# Patient Record
Sex: Male | Born: 1985 | Race: White | Hispanic: No | Smoking: Former smoker
Health system: Southern US, Community
[De-identification: ages and names within clinical notes are randomized; demographics above are authoritative.]

## PROBLEM LIST (undated history)

## (undated) DIAGNOSIS — K219 Gastro-esophageal reflux disease without esophagitis: Secondary | ICD-10-CM

## (undated) DIAGNOSIS — K644 Residual hemorrhoidal skin tags: Secondary | ICD-10-CM

## (undated) HISTORY — PX: KNEE SURGERY: SHX244

## (undated) HISTORY — PX: OTHER SURGICAL HISTORY: SHX169

## (undated) HISTORY — PX: TONSILLECTOMY AND ADENOIDECTOMY: SUR1326

## (undated) HISTORY — DX: Gastro-esophageal reflux disease without esophagitis: K21.9

## (undated) HISTORY — DX: Residual hemorrhoidal skin tags: K64.4

---
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
## 2008-08-30 ENCOUNTER — Emergency Department (HOSPITAL_COMMUNITY): Admission: EM | Admit: 2008-08-30 | Discharge: 2008-08-30 | Payer: Self-pay | Admitting: Emergency Medicine

## 2008-10-06 ENCOUNTER — Encounter: Admission: RE | Admit: 2008-10-06 | Discharge: 2008-10-06 | Payer: Self-pay | Admitting: Internal Medicine

## 2010-06-25 LAB — BASIC METABOLIC PANEL
BUN: 13 mg/dL (ref 6–23)
CO2: 28 mEq/L (ref 19–32)
Calcium: 10 mg/dL (ref 8.4–10.5)
Chloride: 103 mEq/L (ref 96–112)
Creatinine, Ser: 1.01 mg/dL (ref 0.4–1.5)
GFR calc Af Amer: >60 mL/min (ref >60)
GFR calc non Af Amer: >60 mL/min (ref >60)
Glucose, Bld: 89 mg/dL (ref 70–99)
Potassium: 3.8 mEq/L (ref 3.5–5.1)
Sodium: 139 mEq/L (ref 135–145)

## 2010-06-25 LAB — URINALYSIS, ROUTINE W REFLEX MICROSCOPIC
Bilirubin Urine: NEGATIVE
Glucose, UA: NEGATIVE mg/dL
Hgb urine dipstick: NEGATIVE
Ketones, ur: NEGATIVE mg/dL
Nitrite: NEGATIVE
Protein, ur: NEGATIVE mg/dL
Specific Gravity, Urine: 1.02 (ref 1.005–1.030)
Urobilinogen, UA: 0.2 mg/dL (ref 0.0–1.0)
pH: 6 (ref 5.0–8.0)

## 2010-08-03 NOTE — Consult Note (Signed)
NAME:  Aaron Waters, Aaron Waters             ACCOUNT NO.:  [id number]   MEDICAL RECORD NO.:  [id number]          PATIENT TYPE:  AMB   LOCATION:  DAY                           FACILITY:  [facility]   PHYSICIAN:  Kassie Mends, M.D.      DATE OF BIRTH:  1985/06/28   DATE OF CONSULTATION:  05/27/2006  DATE OF DISCHARGE:                                 CONSULTATION   REASON FOR CONSULTATION:  Weight loss and loose bowel movements.   HISTORY OF PRESENT ILLNESS:  Aaron Waters is an 25 year old male who has  always had loose bowel movements since he was a child.  His mother is  present for the history and exam.  She reports he has always had loose  bowel movements since he was a baby.  He always has to eat and go to the  bathroom.  Up until last year, it has not been affecting in the way that  it has been affecting him currently.  He is getting behind in his school  work, and he has also been losing a significant amount of weight.  His  mother and Dr. Neita Carp wondered if he needed to have a colonoscopy.  He  has not been having fevers associated with these stools.  His number of  stools depends on how much he eats.  He is now not eating because he  does not want to have a bowel movement.  He eats and now he may need to  have a bowel movement 10-15 minutes later.  He is not having any blood  in his stool, problems swallowing or pain with swallowing.  He denies  any nausea, vomiting, heartburn or indigestion.  Milk makes it the  symptoms worse, and so he is eliminated that from his diet.  In the  past, he has been tried on a lactose-free diet, but that does not seem  to make a difference in his symptoms.  He has not traveled anywhere and  does have well water.  He has not been on any antibiotics.  No one else  in his family has the same symptoms.  His father does have ulcerative  colitis for the last 15-20 years, but his symptoms are not the same.   PAST MEDICAL HISTORY:  None.   PAST SURGICAL HISTORY:  1.  Knee surgery.  2. Tonsillectomy.  3. Tubes in his ears.   ALLERGIES:  NO KNOWN DRUG ALLERGIES.   MEDICATIONS:  None.   FAMILY HISTORY:  He has an uncle who had colon cancer.   SOCIAL HISTORY:  He is a [employer].  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  He does not drink or smoke.   REVIEW OF SYSTEMS:  Per the HPI; otherwise all systems are negative.   PHYSICAL EXAMINATION:  VITAL SIGNS:  His weight is 196 pounds, height 5  feet 9 inches, BMI 28.9 (overweight), temperature 97.9, blood pressure  108/80, pulse 58.  GENERAL:  He is no apparent distress, alert and  orient x4.  HEENT:  Atraumatic and normocephalic.  Pupils equal and  reactive to light.  Mouth - no oral lesions.  Posterior pharynx without  erythema or exudate.  LUNGS:  Clear to auscultation bilaterally.  CARDIOVASCULAR:  Regular  rhythm, no murmur, normal S1 and S2.  ABDOMEN:  Bowel sounds are present, soft, nontender, nondistended.  No  rebound or guarding.  Unable to appreciate hepatosplenomegaly, abdominal  bruits or pulsatile masses.  EXTREMITIES:  Without cyanosis, clubbing or  edema.NEUROLOGIC:  He has no focal neurologic deficits.   LABORATORY DATA:  Labs from [facility]:  BUN 10, creatinine 0.9.  Sodium 140, potassium 4.8, albumin 4.8, total bilirubin 0.8, alkaline  phosphatase 127, AST 26, ALT 11.  White count 6.1, hemoglobin 14.6,  platelets 232.  Amylase 44, lipase 23.   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  The differential diagnosis  includes celiac sprue, giardiasis and microscopic colitis.  He could  also have a functional gut disorder, small bowel bacterial overgrowth or  lactose intolerance.  The degree of weight loss is concerning.  Thank  you for allowing me to see Aaron Waters in consultation.  My  recommendations follow.   RECOMMENDATIONS:  1. Aaron Waters will be scheduled for an EGD with biopsies of his       duodenum to evaluate for celiac sprue.  A flexible sigmoidoscopy      with biopsies of the small intestines to evaluate for microscopic      colitis will be performed on that day.  2. He will be asked to provide a stool sample to evaluate for      giardiasis.  He will also be asked to bring in a stool sample for      fecal lactoferrin, as well.  3. He has a follow-up appointment to see me in 4-6 weeks.      Kassie Mends, M.D.  Electronically Signed     SM/MEDQ  D:  05/29/2006  T:  05/30/2006  Job:  [id number]   cc:   Fara Chute  Fax: [phone]

## 2010-08-03 NOTE — Op Note (Signed)
NAME:  Aaron Waters, Aaron Waters NO.:  [id number]   MEDICAL RECORD NO.:  [id number]          PATIENT TYPE:  OIB   LOCATION:  6126                         FACILITY:  [facility]   PHYSICIAN:  Burnard Bunting, M.D.    DATE OF BIRTH:  1985-11-23   DATE OF PROCEDURE:  01/25/2004  DATE OF DISCHARGE:                                 OPERATIVE REPORT   PREOPERATIVE DIAGNOSIS:  Left knee patellar instability.   POSTOPERATIVE DIAGNOSIS:  Left knee patellar instability.   OPERATION PERFORMED:  Left knee arthroscopy with intra-articular debridement  and open medial patellofemoral ligament repair and VMO reefing.   SURGEON:  Burnard Bunting, M.D.   ASSISTANT:  Jerolyn Shin. Tresa Res, M.D.   ANESTHESIA:  General endotracheal.   ESTIMATED BLOOD LOSS:  50 mL.   DRAINS:  None.   TOURNIQUET TIME:  120 at 300 mmHg.   DESCRIPTION OF PROCEDURE:  The patient was brought to the operating room  where general endotracheal was induced and preoperative intravenous  antibiotics were administered.  The left leg was prepped with DuraPrep  solution, cleaned to the foot and draped in a sterile manner.  Collier Flowers was  used to cover the operative field.  The leg was elevated and exsanguinated  with the Esmarch wrap.  Tourniquet was inflated.  Diagnostic arthroscopy was  performed through a lateral portal.  A lateral subluxation of the patella  was noted.  Medial retinacular injury was present.  No significant loose  bodies were present within the joint.  Medial and lateral compartments were  intact.  The patient did have synovitis and evidence of injury on the  posterior aspect of the lateral femoral condyle with a loose subchondral  flap.  This was debrided with a shaver using an accessory lateral portal.  At this time following arthroscopic evaluation, the instruments were  removed.  An incision was then made beginning midway between the medial  border of the patella and the adductor tubercle.  Skin and  subcutaneous  tissue was sharply divided.  Bleeding points encountered were controlled  using electrocautery.  The anterior medial one forth of the patella was  exposed.  Skin was elevated off of the VMO down to the adductor tubercle.  At this time the patient was noted to have injury of the medial  patellofemoral ligament off both the medial epicondyle and the medial aspect  of the patella.  Soft tissue sleeve was elevated off of the medial aspect of  the patella.  This tissue sleeve was taken off the patella from the 7  o'clock to the 11 o'clock position on the left knee.  Three layers were  developed including the semimembranosus, including past expansion layer 1  followed by the medial patellofemoral ligament layer 2 and the joint capsule  in layer 3.  This dissection was carried down to the adductor tubercle.  The  ligament injury was noted to have pulled the ligament off of the adductor  tubercle.  Two 5-0 suture anchors were then placed at the adductor tubercle.  The medial patellofemoral ligament was then anchored back down with four  mattress sutures to the adductor tubercle.  ___________ drill was then used  to drill all four holes through the patella exiting anterior to the  articular surface junction.  Using #1 Ethibond suture, imbrication of the  anchor medial patellofemoral ligament was then performed with the patella  well centered.  The sutures were tied after imbrication and good medial  restraint had been restored with the knee in both full extension and 20  degrees of flexion.  He was taken through a range of motion.  Patella was  found to be stable.  The trochlear groove was quite flat.  It was elected at  this time to forego lateral release in order to avoid any possible further  vascular insult into the tibial patella itself.  The patella also was very  stable with the medial plication and repair of the medial patellofemoral  ligament.  The VMO was then advanced over  the tissue on the patella.  At  this time the tourniquet was released.  Bleeding points encountered were  controlled using electrocautery.  Knee joint was thoroughly irrigated.  The  incision was closed using interrupted inverted 0 Vicryl suture, 2-0 Vicryl  suture and running 3-0 pull-out Prolene.  Portal sutures were closed using 3-  0 Prolene.  The patient was then placed in bulky dressing and knee  immobilizer.  The patient tolerated the procedure well without immediate  complications.       GSD/MEDQ  D:  01/26/2004  T:  01/26/2004  Job:  [id number]

## 2010-08-03 NOTE — Op Note (Signed)
NAME:  Aaron Waters, Aaron Waters             ACCOUNT NO.:  [id number]   MEDICAL RECORD NO.:  [id number]          PATIENT TYPE:  AMB   LOCATION:  DAY                           FACILITY:  [facility]   PHYSICIAN:  Kassie Mends, M.D.      DATE OF BIRTH:  April 08, 1985   DATE OF PROCEDURE:  06/02/2006  DATE OF DISCHARGE:                               OPERATIVE REPORT   PROCEDURE:  1. Sigmoidoscopy with cold forceps biopsy.  2. Esophagogastroduodenoscopy with cold forceps biopsy.   INDICATIONS:  Aaron Waters is an 25 year old male with weight loss and  loose bowel movements.  His father has ulcerative colitis.   FINDINGS:  1. Normal colon to the distal transverse colon.  Biopsies obtained to      rule out microscopic colitis.  2. Normal esophagus without evidence of Barrett's.  Normal stomach.      Normal duodenal bulb and second portion of the duodenum.  Biopsies      obtained to evaluate for celiac sprue.   RECOMMENDATIONS:  1. Will call Aaron Waters with biopsy results.  2. No aspirin, NSAIDs or anticoagulation for 3 days.  3. Resume previous diet.  4. He has a follow-up appointment to see me in 4 to 6 weeks.  If his      biopsies are negative, then will obtain stool samples for Giardia      antigen and 72 hour stool collection for fecal fat. Consider 24-      hour urine collection for 5-HIAA core or CT scan of the abdomen to      look for cold pancreatic lesion such as a gastrinoma or other      pancreatic lesion that can cause diarrhea such as a VIPoma.      Consider  the obtaining fasting gastric level or the VIP levels.   MEDICATIONS:  1. Demerol 100 mg IV.  2. Versed 9 mg IV.   PROCEDURE TECHNIQUE:  Physical exam was performed.  Informed consent was  obtained from the patient after explaining the benefits, risks and  alternatives to procedure.  The patient was connected to the monitor and  placed in left lateral position.  Continuous oxygen was provided by  nasal cannula and IV medicine  administered through an indwelling  cannula.  After administration of sedation, the patient's rectum was  intubated and the scope was advanced under direct visualization to the  distal transverse colon.  The scope was subsequently removed slowly by  carefully examining the color, texture, anatomy and end of the mucosa on  the way out.   After the sigmoidoscopy, the patient's esophagus was intubated with a  diagnostic gastroscope and advanced under direct visualization to the  second portion of the duodenum.  The scope was subsequently slowly by  carefully examining color, texture, anatomy and integrity of the mucosa  on the way out.  The patient was recovered in endoscopy suite and  discharged to home in satisfactory condition.      Kassie Mends, M.D.  Electronically Signed     SM/MEDQ  D:  06/02/2006  T:  06/02/2006  Job:  [id number]   cc:   Fara Chute  Fax: [phone]

## 2013-05-24 ENCOUNTER — Emergency Department (HOSPITAL_COMMUNITY)
Admission: EM | Admit: 2013-05-24 | Discharge: 2013-05-24 | Disposition: A | Discharge status: Home / Self Care | Payer: BC Managed Care – PPO | Attending: Emergency Medicine | Admitting: Emergency Medicine

## 2013-05-24 ENCOUNTER — Encounter (HOSPITAL_COMMUNITY): Payer: Self-pay | Admitting: Emergency Medicine

## 2013-05-24 DIAGNOSIS — R1084 Generalized abdominal pain: Secondary | ICD-10-CM | POA: Insufficient documentation

## 2013-05-24 DIAGNOSIS — K625 Hemorrhage of anus and rectum: Secondary | ICD-10-CM | POA: Insufficient documentation

## 2013-05-24 LAB — COMPREHENSIVE METABOLIC PANEL
ALT: 18 U/L (ref 0–53)
AST: 18 U/L (ref 0–37)
Albumin: 4.3 g/dL (ref 3.5–5.2)
Alkaline Phosphatase: 83 U/L (ref 39–117)
BUN: 10 mg/dL (ref 6–23)
CO2: 28 mEq/L (ref 19–32)
Calcium: 9.8 mg/dL (ref 8.4–10.5)
Chloride: 103 mEq/L (ref 96–112)
Creatinine, Ser: 1.02 mg/dL (ref 0.50–1.35)
GFR calc Af Amer: >90 mL/min (ref >90)
GFR calc non Af Amer: >90 mL/min (ref >90)
Glucose, Bld: 92 mg/dL (ref 70–99)
Potassium: 4 mEq/L (ref 3.7–5.3)
Sodium: 142 mEq/L (ref 137–147)
Total Bilirubin: 0.3 mg/dL (ref 0.3–1.2)
Total Protein: 7.2 g/dL (ref 6.0–8.3)

## 2013-05-24 LAB — LIPASE, BLOOD: Lipase: 28 U/L (ref 11–59)

## 2013-05-24 LAB — CBC
HCT: 43.1 % (ref 39.0–52.0)
Hemoglobin: 15 g/dL (ref 13.0–17.0)
MCH: 30.1 pg (ref 26.0–34.0)
MCHC: 34.8 g/dL (ref 30.0–36.0)
MCV: 86.5 fL (ref 78.0–100.0)
Platelets: 235 10*3/uL (ref 150–400)
RBC: 4.98 MIL/uL (ref 4.22–5.81)
RDW: 12.6 % (ref 11.5–15.5)
WBC: 7.6 10*3/uL (ref 4.0–10.5)

## 2013-05-24 LAB — TYPE AND SCREEN
ABO/RH(D): O POS
Antibody Screen: NEGATIVE

## 2013-05-24 LAB — ABO/RH: ABO/RH(D): O POS

## 2013-05-24 NOTE — Discharge Instructions (Signed)
Abdominal Pain, Adult Many things can cause abdominal pain. Usually, abdominal pain is not caused by a disease and will improve without treatment. It can often be observed and treated at home. Your health care provider will do a physical exam and possibly order blood tests and X-rays to help determine the seriousness of your pain. However, in many cases, more time must pass before a clear cause of the pain can be found. Before that point, your health care provider may not know if you need more testing or further treatment. HOME CARE INSTRUCTIONS  Monitor your abdominal pain for any changes. The following actions may help to alleviate any discomfort you are experiencing:  Only take over-the-counter or prescription medicines as directed by your health care provider.  Do not take laxatives unless directed to do so by your health care provider.  Try a clear liquid diet (broth, tea, or water) as directed by your health care provider. Slowly move to a bland diet as tolerated. SEEK MEDICAL CARE IF:  You have unexplained abdominal pain.  You have abdominal pain associated with nausea or diarrhea.  You have pain when you urinate or have a bowel movement.  You experience abdominal pain that wakes you in the night.  You have abdominal pain that is worsened or improved by eating food.  You have abdominal pain that is worsened with eating fatty foods. SEEK IMMEDIATE MEDICAL CARE IF:   Your pain does not go away within 2 hours.  You have a fever.  You keep throwing up (vomiting).  Your pain is felt only in portions of the abdomen, such as the right side or the left lower portion of the abdomen.  You pass bloody or black tarry stools. MAKE SURE YOU:  Understand these instructions.   Will watch your condition.   Will get help right away if you are not doing well or get worse.  Document Released: 12/12/2004 Document Revised: 12/23/2012 Document Reviewed: 11/11/2012 [facility] Patient  Information 2014 [place].  Bloody Stools Bloody stools often mean that there is a problem in the digestive tract. Your caregiver may use the term "melena" to describe black, tarry, and bad smelling stools or "hematochezia" to describe red or maroon-colored stools. Blood seen in the stool can be caused by bleeding anywhere along the intestinal tract.  A black stool usually means that blood is coming from the upper part of the gastrointestinal tract (esophagus, stomach, or small bowel). Passing maroon-colored stools or bright red blood usually means that blood is coming from lower down in the large bowel or the rectum. However, sometimes massive bleeding in the stomach or small intestine can cause bright red bloody stools.  Consuming black licorice, lead, iron pills, medicines containing bismuth subsalicylate, or blueberries can also cause black stools. Your caregiver can test black stools to see if blood is present. It is important that the cause of the bleeding be found. Treatment can then be started, and the problem can be corrected. Rectal bleeding may not be serious, but you should not assume everything is okay until you know the cause.It is very important to follow up with your caregiver or a specialist in gastrointestinal problems. CAUSES  Blood in the stools can come from various underlying causes.Often, the cause is not found during your first visit. Testing is often needed to discover the cause of bleeding in the gastrointestinal tract. Causes range from simple to serious or even life-threatening.Possible causes include:  Hemorrhoids.These are veins that are full of blood (engorged)  in the rectum. They cause pain, inflammation, and may bleed.  Anal fissures.These are areas of painful tearing which may bleed. They are often caused by passing hard stool.  Diverticulosis.These are pouches that form on the colon over time, with age, and may bleed significantly.  Diverticulitis.This  is inflammation in areas with diverticulosis. It can cause pain, fever, and bloody stools, although bleeding is rare.  Proctitis and colitis. These are inflamed areas of the rectum or colon. They may cause pain, fever, and bloody stools.  Polyps and cancer. Colon cancer is a leading cause of preventable cancer death.It often starts out as precancerous polyps that can be removed during a colonoscopy, preventing progression into cancer. Sometimes, polyps and cancer may cause rectal bleeding.  Gastritis and ulcers.Bleeding from the upper gastrointestinal tract (near the stomach) may travel through the intestines and produce black, sometimes tarry, often bad smelling stools. In certain cases, if the bleeding is fast enough, the stools may not be black, but red and the condition may be life-threatening. SYMPTOMS  You may have stools that are bright red and bloody, that are normal color with blood on them, or that are dark black and tarry. In some cases, you may only have blood in the toilet bowl. Any of these cases need medical care. You may also have:  Pain at the anus or anywhere in the rectum.  Lightheadedness or feeling faint.  Extreme weakness.  Nausea or vomiting.  Fever. DIAGNOSIS Your caregiver may use the following methods to find the cause of your bleeding:  Taking a medical history. Age is important. Older people tend to develop polyps and cancer more often. If there is anal pain and a hard, large stool associated with bleeding, a tear of the anus may be the cause. If blood drips into the toilet after a bowel movement, bleeding hemorrhoids may be the problem. The color and frequency of the bleeding are additional considerations. In most cases, the medical history provides clues, but seldom the final answer.  A visual and finger (digital) exam. Your caregiver will inspect the anal area, looking for tears and hemorrhoids. A finger exam can provide information when there is tenderness or  a growth inside. In men, the prostate is also examined.  Endoscopy. Several types of small, long scopes (endoscopes) are used to view the colon.  In the office, your caregiver may use a rigid, or more commonly, a flexible viewing sigmoidoscope. This exam is called flexible sigmoidoscopy. It is performed in 5 to 10 minutes.  A more thorough exam is accomplished with a colonoscope. It allows your caregiver to view the entire 5 to 6 foot long colon. Medicine to help you relax (sedative) is usually given for this exam. Frequently, a bleeding lesion may be present beyond the reach of the sigmoidoscope. So, a colonoscopy may be the best exam to start with. Both exams are usually done on an outpatient basis. This means the patient does not stay overnight in the hospital or surgery center.  An upper endoscopy may be needed to examine your stomach. Sedation is used and a flexible endoscope is put in your mouth, down to your stomach.  A barium enema X-ray. This is an X-ray exam. It uses liquid barium inserted by enema into the rectum. This test alone may not identify an actual bleeding point. X-rays highlight abnormal shadows, such as those made by lumps (tumors), diverticuli, or colitis. TREATMENT  Treatment depends on the cause of your bleeding.   For bleeding from  the stomach or colon, the caregiver doing your endoscopy or colonoscopy may be able to stop the bleeding as part of the procedure.  Inflammation or infection of the colon can be treated with medicines.  Many rectal problems can be treated with creams, suppositories, or warm baths.  Surgery is sometimes needed.  Blood transfusions are sometimes needed if you have lost a lot of blood.  For any bleeding problem, let your caregiver know if you take aspirin or other blood thinners regularly. HOME CARE INSTRUCTIONS   Take any medicines exactly as prescribed.  Keep your stools soft by eating a diet high in fiber. Prunes (1 to 3 a day) work  well for many people.  Drink enough water and fluids to keep your urine clear or pale yellow.  Take sitz baths if advised. A sitz bath is when you sit in a bathtub with warm water for 10 to 15 minutes to soak, soothe, and cleanse the rectal area.  If enemas or suppositories are advised, be sure you know how to use them. Tell your caregiver if you have problems with this.  Monitor your bowel movements to look for signs of improvement or worsening. SEEK MEDICAL CARE IF:   You do not improve in the time expected.  Your condition worsens after initial improvement.  You develop any new symptoms. SEEK IMMEDIATE MEDICAL CARE IF:   You develop severe or prolonged rectal bleeding.  You vomit blood.  You feel weak or faint.  You have a fever. MAKE SURE YOU:  Understand these instructions.  Will watch your condition.  Will get help right away if you are not doing well or get worse. Document Released: 02/22/2002 Document Revised: 05/27/2011 Document Reviewed: 07/20/2010 [facility] Patient Information 2014 [place].

## 2013-05-24 NOTE — ED Provider Notes (Signed)
CSN: [id number]     Arrival date & time 05/24/13  1135 History   First MD Initiated Contact with Patient 05/24/13 1421     Chief Complaint  Patient presents with  . Rectal Bleeding     Patient is a 28 y.o. male presenting with hematochezia. The history is provided by the patient.  Rectal Bleeding Quality:  Bright red Duration:  12 months Timing:  Intermittent (Pt noticed it every time he has a bowel movement.) Context: not anal fissures, not rectal injury and not rectal pain   Relieved by:  Nothing Worsened by:  Nothing tried Ineffective treatments:  None tried Associated symptoms: abdominal pain   Associated symptoms: no fever, no hematemesis and no vomiting     History reviewed. No pertinent past medical history. History reviewed. No pertinent past surgical history. Family History  Problem Relation Age of Onset  . Ulcerative colitis Father    History  Substance Use Topics  . Smoking status: Never Smoker   . Smokeless tobacco: Not on file  . Alcohol Use: No    Review of Systems  Constitutional: Negative for fever.  Gastrointestinal: Positive for abdominal pain and hematochezia. Negative for vomiting and hematemesis.  All other systems reviewed and are negative.      Allergies  Review of patient's allergies indicates no known allergies.  Home Medications  No current outpatient prescriptions on file. BP 115/67  Pulse 61  Temp(Src) 98.5 F (36.9 C) (Oral)  Resp 18  Wt 241 lb 11.2 oz (109.634 kg)  SpO2 97% Physical Exam  Nursing note and vitals reviewed. Constitutional: He appears well-developed and well-nourished. No distress.  HENT:  Head: Normocephalic and atraumatic.  Right Ear: External ear normal.  Left Ear: External ear normal.  Eyes: Conjunctivae are normal. Right eye exhibits no discharge. Left eye exhibits no discharge. No scleral icterus.  Neck: Neck supple. No tracheal deviation present.  Cardiovascular: Normal rate, regular rhythm and intact  distal pulses.   Pulmonary/Chest: Effort normal and breath sounds normal. No stridor. No respiratory distress. He has no wheezes. He has no rales.  Abdominal: Soft. Bowel sounds are normal. He exhibits no distension. There is generalized tenderness. There is no rigidity, no rebound and no guarding. No hernia.  Genitourinary: Rectal exam shows no external hemorrhoid, no internal hemorrhoid and no mass.  No gross blood on rectal exam  Musculoskeletal: He exhibits no edema and no tenderness.  Neurological: He is alert. He has normal strength. No cranial nerve deficit (no facial droop, extraocular movements intact, no slurred speech) or sensory deficit. He exhibits normal muscle tone. He displays no seizure activity. Coordination normal.  Skin: Skin is warm and dry. No rash noted.  Psychiatric: He has a normal mood and affect.    ED Course  Procedures (including critical care time) Labs Review Labs Reviewed  CBC  COMPREHENSIVE METABOLIC PANEL  LIPASE, BLOOD  TYPE AND SCREEN  ABO/RH     MDM   Final diagnoses:  Rectal bleeding    The patient's laboratory testing and exam are reassuring. Patient does have a family history of ulcerative colitis. Considering the one year duration of his symptoms further evaluation is certainly indicated. I discussed the importance of outpatient followup with a gastroenterologist. The family would like to followup with Dr. Karilyn Cota in [place]  At this time there does not appear to be any evidence of an acute emergency medical condition and the patient appears stable for discharge with appropriate outpatient follow up.  Celene KrasJon R Ronalda Walpole, MD 05/24/13 [phone]

## 2013-05-24 NOTE — ED Notes (Signed)
Per pt sts generalized abdominal pain with rectal bleeding x 2 weeks. sts now he is having more blood than stool. sts bright red.

## 2013-05-24 NOTE — ED Notes (Signed)
Pt comfortable with discharge and follow up instructions. No prescriptions. 

## 2013-05-28 ENCOUNTER — Ambulatory Visit (INDEPENDENT_AMBULATORY_CARE_PROVIDER_SITE_OTHER): Payer: BC Managed Care – PPO | Admitting: Physician Assistant

## 2013-05-28 ENCOUNTER — Other Ambulatory Visit (INDEPENDENT_AMBULATORY_CARE_PROVIDER_SITE_OTHER): Payer: BC Managed Care – PPO

## 2013-05-28 ENCOUNTER — Telehealth: Payer: Self-pay | Admitting: Gastroenterology

## 2013-05-28 ENCOUNTER — Encounter: Payer: Self-pay | Admitting: Physician Assistant

## 2013-05-28 VITALS — BP 124/74 | HR 66 | Ht 72.0 in | Wt 242.2 lb

## 2013-05-28 DIAGNOSIS — R109 Unspecified abdominal pain: Secondary | ICD-10-CM

## 2013-05-28 DIAGNOSIS — R197 Diarrhea, unspecified: Secondary | ICD-10-CM

## 2013-05-28 DIAGNOSIS — K219 Gastro-esophageal reflux disease without esophagitis: Secondary | ICD-10-CM

## 2013-05-28 DIAGNOSIS — K625 Hemorrhage of anus and rectum: Secondary | ICD-10-CM

## 2013-05-28 LAB — CBC WITH DIFFERENTIAL/PLATELET
BASOS ABS: 0.1 10*3/uL (ref 0.0–0.1)
Basophils Relative: 1.1 % (ref 0.0–3.0)
EOS ABS: 0.2 10*3/uL (ref 0.0–0.7)
Eosinophils Relative: 2.1 % (ref 0.0–5.0)
HCT: 44.3 % (ref 39.0–52.0)
Hemoglobin: 15.2 g/dL (ref 13.0–17.0)
LYMPHS PCT: 27.8 % (ref 12.0–46.0)
Lymphs Abs: 2.5 10*3/uL (ref 0.7–4.0)
MCHC: 34.2 g/dL (ref 30.0–36.0)
MCV: 86.9 fl (ref 78.0–100.0)
Monocytes Absolute: 0.6 10*3/uL (ref 0.1–1.0)
Monocytes Relative: 7.1 % (ref 3.0–12.0)
Neutro Abs: 5.5 10*3/uL (ref 1.4–7.7)
Neutrophils Relative %: 61.9 % (ref 43.0–77.0)
Platelets: 268 10*3/uL (ref 150.0–400.0)
RBC: 5.1 Mil/uL (ref 4.22–5.81)
RDW: 12.7 % (ref 11.5–14.6)
WBC: 8.8 10*3/uL (ref 4.5–10.5)

## 2013-05-28 LAB — HIGH SENSITIVITY CRP: CRP, High Sensitivity: 0.69 mg/L (ref 0.000–5.000)

## 2013-05-28 MED ORDER — DICYCLOMINE HCL 10 MG PO CAPS: ORAL_CAPSULE | ORAL | Status: DC

## 2013-05-28 MED ORDER — MOVIPREP 100 G PO SOLR: 1.0000 | ORAL | Status: DC

## 2013-05-28 NOTE — Telephone Encounter (Signed)
Pt seen in ER this week for rectal bleeding. State it has been going on for a while but has gotten worse. They were going to try and see a GI doc in [place] but were told they could not be seen for 3-4 weeks. Pt scheduled to see Mike GipAmy Esterwood PA today at 3:30pm. Pt aware of appt.

## 2013-05-28 NOTE — Patient Instructions (Signed)
We have given you a work note. We sent prescriptions to [facility]. 1. Bentyl ( dicyclomine) 2. Moviprep for the colonoscopy  Take Tylenol or Tylenol Extra Strength as needed for pain.  You have been scheduled for a colonoscopy with propofol. Please follow written instructions given to you at your visit today.  Please pick up your prep kit at the pharmacy within the next 1-3 days. [facility] [place], [place]. If you use inhalers (even only as needed), please bring them with you on the day of your procedure.

## 2013-05-28 NOTE — Progress Notes (Addendum)
Subjective:    Patient ID: Aaron Waters, male    DOB: 30-Sep-1985, 28 y.o.   MRN: [id number]  HPI  Muhamad is a very nice 28 year old white male generally in good health with no known chronic medical problems. He comes in today is a new patient after an ER visit on 05/24/2013. Patient reports that he has had intermittent rectal bleeding over the past year and has had loose stools for many years. His mother says that he has always had some postprandial urgency and usually has a bowel movement after eating a meal. Over the past couple of months he has had an increase in rectal bleeding and now over the past week or so he says that he is primarily just passing blood whenever he has a bowel movement. His stools are liquid and he is having 5-7 bowel movements per day. He has also developed abdominal cramping and discomfort over the past few weeks. No documented fever or chills. He says in general he doesn't feel very well and has had some lightheadedness while at work. Appetite has been okay, weight stable, no vomiting.  Patient is not on any regular meds or over-the-counter medicines he occasionally takes Nexium for reflux. Family history is positive for remote diagnosis of ulcerative colitis in his father  ER evaluation on 39 labs showed hemoglobin of 15 hematocrit of 43 WBC of 7.6 C. met was normal no imaging done.    Review of Systems  Constitutional: Positive for appetite change.  HENT: Negative.   Eyes: Negative.   Respiratory: Negative.   Cardiovascular: Negative.   Gastrointestinal: Positive for abdominal pain, diarrhea and blood in stool.  Endocrine: Negative.   Genitourinary: Negative.   Musculoskeletal: Negative.   Allergic/Immunologic: Negative.   Neurological: Negative.   Hematological: Negative.   Psychiatric/Behavioral: Negative.    No outpatient prescriptions prior to visit.   No facility-administered medications prior to visit.   No Known Allergies     Patient  Active Problem List   Diagnosis Date Noted  . GERD (gastroesophageal reflux disease) 05/28/2013   History  Substance Use Topics  . Smoking status: Former Smoker    Types: Cigarettes  . Smokeless tobacco: Current User    Types: Snuff, Chew     Comment: tobacco info given 05/28/13  . Alcohol Use: No   family history includes Colon cancer (age of onset: 90) in his maternal uncle; Heart attack in his paternal grandfather; Ulcerative colitis in his father.  Objective:   Physical Exam  well-developed white male in no acute distress, accompanied by his mother blood pressure 124/74 pulse 66 height 6 foot weight 242. HEENT; nontraumatic normocephalic EOMI PERRLA sclera anicteric is, Supple ;no JVD, Cardiovascular; regular rate and rhythm with S1-S2 no murmur or gallop, Pulmonary; clear bilaterally, Abdomen; soft bowel sounds are somewhat hyperactive he is quite tender in the left lower quadrant left mid quadrant and in the epigastrium but no guarding or rebound no palpable mass or hepatosplenomegaly, Rectal ;exam not repeated this was done in the ER earlier this week no gross blood noted and negative digital exam, Extremities; no clubbing cyanosis or edema skin warm and dry, Psych; mood and affect appropriate        Assessment & Plan:  #52  28 year old male with a one-year history of intermittent rectal bleeding now with progressive symptoms over the past month and development of left-sided abdominal pain/cramping. Patient with 5-7 diarrheal stools per day which have been bloody over the past week and a  half High suspicion for inflammatory bowel disease/ulcerative colitis Recent hemoglobin was normal  #2 GERD-uses periodic Nexium Plan; repeat CBC today, check CRP Schedule for colonoscopy with Dr. Hilarie Fredrickson next week-procedure was discussed in detail with the patient and his mother and they're agreeable to proceed Patient was given a note to remain out of work until he has the procedure done Start  bentyl 10 mg every 6 hours as needed for abdominal cramping  Addendum: Reviewed and agree with initial management. Jerene Bears, MD

## 2013-06-01 ENCOUNTER — Other Ambulatory Visit: Payer: Self-pay | Admitting: Gastroenterology

## 2013-06-02 ENCOUNTER — Encounter (HOSPITAL_COMMUNITY)
Admission: RE | Disposition: A | Discharge status: Home / Self Care | Payer: Self-pay | Source: Ambulatory Visit | Attending: Internal Medicine

## 2013-06-02 ENCOUNTER — Other Ambulatory Visit: Payer: Self-pay | Admitting: Internal Medicine

## 2013-06-02 ENCOUNTER — Encounter: Payer: Self-pay | Admitting: *Deleted

## 2013-06-02 ENCOUNTER — Ambulatory Visit (HOSPITAL_COMMUNITY)
Admission: RE | Admit: 2013-06-02 | Discharge: 2013-06-02 | Disposition: A | Discharge status: Home / Self Care | Payer: BC Managed Care – PPO | Source: Ambulatory Visit | Attending: Internal Medicine | Admitting: Internal Medicine

## 2013-06-02 ENCOUNTER — Encounter (HOSPITAL_COMMUNITY): Payer: Self-pay

## 2013-06-02 DIAGNOSIS — K219 Gastro-esophageal reflux disease without esophagitis: Secondary | ICD-10-CM | POA: Insufficient documentation

## 2013-06-02 DIAGNOSIS — Z8379 Family history of other diseases of the digestive system: Secondary | ICD-10-CM | POA: Insufficient documentation

## 2013-06-02 DIAGNOSIS — R109 Unspecified abdominal pain: Secondary | ICD-10-CM

## 2013-06-02 DIAGNOSIS — Z800 Family history of malignant neoplasm of digestive organs: Secondary | ICD-10-CM | POA: Insufficient documentation

## 2013-06-02 DIAGNOSIS — Z87891 Personal history of nicotine dependence: Secondary | ICD-10-CM | POA: Insufficient documentation

## 2013-06-02 DIAGNOSIS — K644 Residual hemorrhoidal skin tags: Secondary | ICD-10-CM | POA: Insufficient documentation

## 2013-06-02 DIAGNOSIS — K625 Hemorrhage of anus and rectum: Secondary | ICD-10-CM

## 2013-06-02 DIAGNOSIS — R197 Diarrhea, unspecified: Secondary | ICD-10-CM

## 2013-06-02 HISTORY — PX: COLONOSCOPY: SHX5424

## 2013-06-02 LAB — CBC WITH DIFFERENTIAL/PLATELET
BASOS ABS: 0 10*3/uL (ref 0.0–0.1)
Basophils Relative: 0 % (ref 0–1)
EOS PCT: 1 % (ref 0–5)
Eosinophils Absolute: 0.1 10*3/uL (ref 0.0–0.7)
HCT: 46.2 % (ref 39.0–52.0)
Hemoglobin: 15.8 g/dL (ref 13.0–17.0)
Lymphocytes Relative: 29 % (ref 12–46)
Lymphs Abs: 2.1 10*3/uL (ref 0.7–4.0)
MCH: 29.4 pg (ref 26.0–34.0)
MCHC: 34.2 g/dL (ref 30.0–36.0)
MCV: 86 fL (ref 78.0–100.0)
Monocytes Absolute: 0.5 10*3/uL (ref 0.1–1.0)
Monocytes Relative: 7 % (ref 3–12)
Neutro Abs: 4.4 10*3/uL (ref 1.7–7.7)
Neutrophils Relative %: 62 % (ref 43–77)
PLATELETS: 232 10*3/uL (ref 150–400)
RBC: 5.37 MIL/uL (ref 4.22–5.81)
RDW: 12.7 % (ref 11.5–15.5)
WBC: 7 10*3/uL (ref 4.0–10.5)

## 2013-06-02 LAB — HIGH SENSITIVITY CRP: CRP HIGH SENSITIVITY: 2 mg/L

## 2013-06-02 SURGERY — COLONOSCOPY
Anesthesia: Moderate Sedation

## 2013-06-02 MED ORDER — FENTANYL CITRATE 0.05 MG/ML IJ SOLN
INTRAMUSCULAR | Status: DC | PRN
  Administered 2013-06-02 (×4): 25 ug via INTRAVENOUS

## 2013-06-02 MED ORDER — DIPHENHYDRAMINE HCL 50 MG/ML IJ SOLN
INTRAMUSCULAR | Status: AC
  Filled 2013-06-02: qty 1

## 2013-06-02 MED ORDER — SODIUM CHLORIDE 0.9 % IV SOLN: INTRAVENOUS | Status: DC

## 2013-06-02 MED ORDER — MIDAZOLAM HCL 5 MG/5ML IJ SOLN
INTRAMUSCULAR | Status: DC | PRN
  Administered 2013-06-02 (×4): 2 mg via INTRAVENOUS

## 2013-06-02 MED ORDER — FENTANYL CITRATE 0.05 MG/ML IJ SOLN
INTRAMUSCULAR | Status: AC
  Filled 2013-06-02: qty 4

## 2013-06-02 MED ORDER — HYDROCORTISONE ACETATE 25 MG RE SUPP: 25.0000 mg | Freq: Two times a day (BID) | RECTAL | Status: DC

## 2013-06-02 MED ORDER — DIPHENHYDRAMINE HCL 50 MG/ML IJ SOLN
INTRAMUSCULAR | Status: DC | PRN
  Administered 2013-06-02: 25 mg via INTRAVENOUS

## 2013-06-02 MED ORDER — MIDAZOLAM HCL 10 MG/2ML IJ SOLN
INTRAMUSCULAR | Status: AC
  Filled 2013-06-02: qty 4

## 2013-06-02 NOTE — Op Note (Signed)
[facility] [address] [place], [id number]   COLONOSCOPY PROCEDURE REPORT  PATIENT: Aaron Waters, Aaron F.  MR#: [id number] BIRTHDATE: 09/14/1985 , 27  yrs. old GENDER: Male ENDOSCOPIST: Beverley FiedlerJay M Pyrtle, MD PROCEDURE DATE:  06/02/2013 PROCEDURE:   Colonoscopy with biopsy First Screening Colonoscopy - Avg.  risk and is 50 yrs.  old or older - No.  Prior Negative Screening - Now for repeat screening. N/A  History of Adenoma - Now for follow-up colonoscopy & has been > or = to 3 yrs.  N/A  Polyps Removed Today? No.  Recommend repeat exam, <10 yrs? No. ASA CLASS:   Class II INDICATIONS:Rectal Bleeding.   Loose stools.  Left-sided abdominal pain.  Family history of colitis MEDICATIONS: These medications were titrated to patient response per physician's verbal order, Diphenhydramine (Benadryl) 25 mg IV, Fentanyl 100 mcg IV, and Versed 8 mg IV  DESCRIPTION OF PROCEDURE:   After the risks benefits and alternatives of the procedure were thoroughly explained, informed consent was obtained.  A digital rectal exam revealed external hemorrhoids.   The Adult Pentax colonoscope was introduced through the anus and advanced to the terminal ileum which was intubated for a short distance. No adverse events experienced.   The quality of the prep was good, using MoviPrep  The instrument was then slowly withdrawn as the colon was fully examined.   COLON FINDINGS: The mucosa appeared normal in the terminal ileum. The colonic mucosa appeared normal throughout the entire examined colon.  There was no evidence of mucosal inflammation and no polyps or tumors were seen. Multiple biopsies were performed from the rectum to exclude proctitis.  Retroflexed views revealed small, mildly inflamed external hemorrhoid.      The scope was withdrawn and the procedure completed.  COMPLICATIONS: There were no complications.  ENDOSCOPIC IMPRESSION: 1.   Normal mucosa in the terminal ileum 2.   The  colonic mucosa appeared normal throughout the entire examined colon; rectal biopsies  RECOMMENDATIONS: 1.  Await biopsy results 2.  Hydrocortisone suppository 25 mg twice daily for 5 days 3.  Bentyl as directed and as needed for lower abdominal cramping/pain 4.  Office follow-up   eSigned:  Beverley FiedlerJay M Pyrtle, MD 06/02/2013 9:26 AM  cc: The Patient

## 2013-06-02 NOTE — Interval H&P Note (Signed)
History and Physical Interval Note: Patient seen last week in the office by Mike GipAmy Esterwood, PA-C evaluate left-sided abdominal pain loose stools with intermittent bleeding. Family history of ulcerative colitis. Here today for colonoscopy. No significant change in symptoms. Tolerated prep well. The nature of the procedure, as well as the risks, benefits, and alternatives were carefully and thoroughly reviewed with the patient. Ample time for discussion and questions allowed. The patient understood, was satisfied, and agreed to proceed.     06/02/2013 8:42 AM  Rhona Leavensouglas F Ashenfelter  has presented today for surgery, with the diagnosis of Diarrhea 787.91 Rectal bleeding 569.3 Abdominal pain  The various methods of treatment have been discussed with the patient and family. After consideration of risks, benefits and other options for treatment, the patient has consented to  Procedure(s): COLONOSCOPY (N/A) as a surgical intervention .  The patient's history has been reviewed, patient examined, no change in status, stable for surgery.  I have reviewed the patient's chart and labs.  Questions were answered to the patient's satisfaction.     PYRTLE, JAY M

## 2013-06-02 NOTE — Discharge Instructions (Signed)

## 2013-06-02 NOTE — H&P (View-Only) (Signed)
Subjective:    Patient ID: BRAYAM BOEKE, male    DOB: 19-Oct-1985, 28 y.o.   MRN: [id number]  HPI  Nikalas is a very nice 28 year old white male generally in good health with no known chronic medical problems. He comes in today is a new patient after an ER visit on 05/24/2013. Patient reports that he has had intermittent rectal bleeding over the past year and has had loose stools for many years. His mother says that he has always had some postprandial urgency and usually has a bowel movement after eating a meal. Over the past couple of months he has had an increase in rectal bleeding and now over the past week or so he says that he is primarily just passing blood whenever he has a bowel movement. His stools are liquid and he is having 5-7 bowel movements per day. He has also developed abdominal cramping and discomfort over the past few weeks. No documented fever or chills. He says in general he doesn't feel very well and has had some lightheadedness while at work. Appetite has been okay, weight stable, no vomiting.  Patient is not on any regular meds or over-the-counter medicines he occasionally takes Nexium for reflux. Family history is positive for remote diagnosis of ulcerative colitis in his father  ER evaluation on 39 labs showed hemoglobin of 15 hematocrit of 43 WBC of 7.6 C. met was normal no imaging done.    Review of Systems  Constitutional: Positive for appetite change.  HENT: Negative.   Eyes: Negative.   Respiratory: Negative.   Cardiovascular: Negative.   Gastrointestinal: Positive for abdominal pain, diarrhea and blood in stool.  Endocrine: Negative.   Genitourinary: Negative.   Musculoskeletal: Negative.   Allergic/Immunologic: Negative.   Neurological: Negative.   Hematological: Negative.   Psychiatric/Behavioral: Negative.    No outpatient prescriptions prior to visit.   No facility-administered medications prior to visit.   No Known Allergies     Patient  Active Problem List   Diagnosis Date Noted  . GERD (gastroesophageal reflux disease) 05/28/2013   History  Substance Use Topics  . Smoking status: Former Smoker    Types: Cigarettes  . Smokeless tobacco: Current User    Types: Snuff, Chew     Comment: tobacco info given 05/28/13  . Alcohol Use: No   family history includes Colon cancer (age of onset: 67) in his maternal uncle; Heart attack in his paternal grandfather; Ulcerative colitis in his father.  Objective:   Physical Exam  well-developed white male in no acute distress, accompanied by his mother blood pressure 124/74 pulse 66 height 6 foot weight 242. HEENT; nontraumatic normocephalic EOMI PERRLA sclera anicteric is, Supple ;no JVD, Cardiovascular; regular rate and rhythm with S1-S2 no murmur or gallop, Pulmonary; clear bilaterally, Abdomen; soft bowel sounds are somewhat hyperactive he is quite tender in the left lower quadrant left mid quadrant and in the epigastrium but no guarding or rebound no palpable mass or hepatosplenomegaly, Rectal ;exam not repeated this was done in the ER earlier this week no gross blood noted and negative digital exam, Extremities; no clubbing cyanosis or edema skin warm and dry, Psych; mood and affect appropriate        Assessment & Plan:  #32  28 year old male with a one-year history of intermittent rectal bleeding now with progressive symptoms over the past month and development of left-sided abdominal pain/cramping. Patient with 5-7 diarrheal stools per day which have been bloody over the past week and a  half High suspicion for inflammatory bowel disease/ulcerative colitis Recent hemoglobin was normal  #2 GERD-uses periodic Nexium Plan; repeat CBC today, check CRP Schedule for colonoscopy with Dr. Hilarie Fredrickson next week-procedure was discussed in detail with the patient and his mother and they're agreeable to proceed Patient was given a note to remain out of work until he has the procedure done Start  bentyl 10 mg every 6 hours as needed for abdominal cramping  Addendum: Reviewed and agree with initial management. Jerene Bears, MD

## 2013-06-02 NOTE — Progress Notes (Signed)
Procedure results given to patient at discharge in sealed envelope per patient request.

## 2013-06-03 ENCOUNTER — Encounter (HOSPITAL_COMMUNITY): Payer: Self-pay | Admitting: Internal Medicine

## 2013-06-04 ENCOUNTER — Encounter: Payer: Self-pay | Admitting: Internal Medicine

## 2014-03-31 ENCOUNTER — Encounter (HOSPITAL_COMMUNITY): Payer: Self-pay | Admitting: Internal Medicine

## 2014-09-20 ENCOUNTER — Telehealth: Payer: Self-pay | Admitting: Physician Assistant

## 2014-09-20 ENCOUNTER — Ambulatory Visit: Payer: Self-pay | Admitting: Physician Assistant

## 2014-09-20 NOTE — Telephone Encounter (Signed)
Per pt he had to r/s his appointment, because he went to the wrong location. He says he is having a lot of pain and would like to know if there is anything sooner. He would also like to know if he could have a note because he does a lot of lifting and feels like his insides are falling out.

## 2014-09-21 NOTE — Telephone Encounter (Signed)
Patient will keep his appointment tomorrow. He states he is very uncomfortable, but if he stays still he does better. He has called out of work.

## 2014-09-22 ENCOUNTER — Ambulatory Visit (INDEPENDENT_AMBULATORY_CARE_PROVIDER_SITE_OTHER): Payer: BLUE CROSS/BLUE SHIELD | Admitting: Physician Assistant

## 2014-09-22 ENCOUNTER — Encounter: Payer: Self-pay | Admitting: Physician Assistant

## 2014-09-22 DIAGNOSIS — K6289 Other specified diseases of anus and rectum: Secondary | ICD-10-CM

## 2014-09-22 DIAGNOSIS — R109 Unspecified abdominal pain: Secondary | ICD-10-CM | POA: Diagnosis not present

## 2014-09-22 DIAGNOSIS — K625 Hemorrhage of anus and rectum: Secondary | ICD-10-CM | POA: Diagnosis not present

## 2014-09-22 MED ORDER — DILTIAZEM GEL 2 %: CUTANEOUS | Status: DC

## 2014-09-22 MED ORDER — LIDOCAINE (ANORECTAL) 5 % EX GEL: 1.0000 "application " | Freq: Four times a day (QID) | CUTANEOUS | Status: DC

## 2014-09-22 NOTE — Progress Notes (Addendum)
Patient ID: Aaron Waters, male   DOB: 08-16-1985, 29 y.o.   MRN: [id number]   Subjective:    Patient ID: Aaron Waters, male    DOB: Jun 08, 1985, 29 y.o.   MRN: [id number]  HPI  Aaron Waters is a 29 year old white male known to Dr.Pyrtle who had undergone evaluation a little over a year ago for complaints of rectal bleeding and loose stools. He has family history of ulcerative colitis in his father. He underwent colonoscopy March 2015 which was a normal exam he is were taken to rule out proctitis and these were also benign. He had been given a trial of dicyclomine and a cortisone suppository. He was documented at the time of colonoscopy does have a small external hemorrhoid.  He comes in today stating that she's been having ongoing problems over the past year, he did not find dicyclomine beneficial and did not continue it. He feels that the suppository made his symptoms worse. He tried over-the-counter hemorrhoid cream without any benefit. He says he sees some blood every time he has a bowel movement and feels like there are some protrusion from his rectum with bowel movements. He is also complaining of a ripping painful sensation with each bowel movement. After having a bowel movement he will have some left mid abdominal discomfort. He says sometimes this is bad and he feels better if he lays down. He also intermittently has left mid abdominal discomfort with lifting at work in this same area.  He says his bowel movements are generally normal, without excessive straining etc.  Review of Systems Pertinent positive and negative review of systems were noted in the above HPI section.  All other review of systems was otherwise negative.  Outpatient Encounter Prescriptions as of 09/22/2014  Medication Sig  . esomeprazole (NEXIUM) 40 MG capsule Take 40 mg by mouth daily at 12 noon.  . diltiazem 2 % GEL Apply gel 3-4 times daily for 2 months.  . Lidocaine, Anorectal, 5 % GEL Apply 1 application topically 4  (four) times daily.  . [DISCONTINUED] dicyclomine (BENTYL) 10 MG capsule Take 1 tab 3-4 times daily as needed for cramping, abdominal pain.  . [DISCONTINUED] hydrocortisone (ANUSOL-HC) 25 MG suppository Place 1 suppository (25 mg total) rectally every 12 (twelve) hours.   No facility-administered encounter medications on file as of 09/22/2014.   No Known Allergies Patient Active Problem List   Diagnosis Date Noted  . GERD (gastroesophageal reflux disease) 05/28/2013   History   Social History  . Marital Status: Divorced    Spouse Name: N/A  . Number of Children: 2  . Years of Education: N/A   Occupational History  . Ecologist    Social History Main Topics  . Smoking status: Former Smoker    Types: Cigarettes  . Smokeless tobacco: Current User    Types: Chew  . Alcohol Use: No  . Drug Use: No  . Sexual Activity: Not on file   Other Topics Concern  . Not on file   Social History Narrative    Aaron Waters family history includes Colon cancer (age of onset: 70) in his maternal uncle; Heart attack in his paternal grandfather; Ulcerative colitis in his father.      Objective:    Filed Vitals:   09/22/14 1338  BP: 110/60  Pulse: 80    Physical Exam   Well-developed white male in no acute distress, pleasant, anxious blood pressure 110/60 pulse 80 height 6 foot weight 261. HEENT; nontraumatic, cephalic EOMI  PERRLA sclera anicteric, Cardiovascular; regular rate and rhythm with S1-S2 no murmur or gallop, Pulmonary; clear, Abdomen ;soft he is tender in the left mid abdomen into the left of the umbilicus there is no definite palpable hernia , no mass or hepatosplenomegaly bowel sounds are present, Rectal; exam small hemorrhoidal tag squeezes it lead tender to digital exam and could not do anoscopy, on digital exam there is a fissure at the 3:00 position  Extremities; no clubbing cyanosis or edema skin warm and dry, Psych; mood and affect appropriate       Assessment &  Plan:   #1 29 yo male with persistent BRB with BM's and rectal pain -exam consistent  with anal fissure .  #2 intermittent left mid abdominal pain - exacerbated by straining for BM and lifting- r/o ventral hernia-vs muscular pain  Plan; start Lidocaine  5%- 4 times daily for anal pain Start Cardizem gel 2% -apply 3-4 times daily x 2 months moistened wipes etc  Ct abd/pelvis for persistent left-sided abd pain Follow up with Dr. Rhea Waters in 2 months Discussed anal fissures and slow nature of healing    Aaron Wilford S [name] PA-C 09/22/2014   Cc: Waters, Aaron Banda  Addendum: Reviewed and agree with initial management. Aaron Fiedler, MD

## 2014-09-22 NOTE — Patient Instructions (Signed)
We sent prescriptons to [facility].   1. Lidocaine jelly.5 %,  2.. Diltiazem Gel 2 %.     You have been scheduled for a CT scan of the abdomen and pelvis at [facility] ([address]---this is in the same building as Press photographer).   You are scheduled on Tues 09-27-2014 at 1:30 PM . You should arrive at 1:15 minutes prior to your appointment time for registration. Please follow the written instructions below on the day of your exam:  WARNING: IF YOU ARE ALLERGIC TO IODINE/X-RAY DYE, PLEASE NOTIFY RADIOLOGY IMMEDIATELY AT [phone]! YOU WILL BE GIVEN A 13 HOUR PREMEDICATION PREP.  1) Do not eat after 9:30 am  (4 hours prior to your test) 2) You have been given 2 bottles of oral contrast to drink. The solution may taste   better if refrigerated, but do NOT add ice or any other liquid to this solution. Shake  well before drinking.    Drink 1 bottle of contrast @ 11:30 am  (2 hours prior to your exam)  Drink 1 bottle of contrast @ 12:30 PM (1 hour prior to your exam)  You may take any medications as prescribed with a small amount of water except for the following: Metformin, Glucophage, Glucovance, Avandamet, Riomet, Fortamet, Actoplus Met, Janumet, Glumetza or Metaglip. The above medications must be held the day of the exam AND 48 hours after the exam.  The purpose of you drinking the oral contrast is to aid in the visualization of your intestinal tract. The contrast solution may cause some diarrhea. Before your exam is started, you will be given a small amount of fluid to drink. Depending on your individual set of symptoms, you may also receive an intravenous injection of x-ray contrast/dye. Plan on being at [facility] for 30 minutes or long, depending on the type of exam you are having performed.  If you have any questions regarding your exam or if you need to reschedule, you may call the CT department at [phone] between the hours of 8:00 am and 5:00  pm, Monday-Friday.  ________________________________________________________________________

## 2014-09-27 ENCOUNTER — Ambulatory Visit (INDEPENDENT_AMBULATORY_CARE_PROVIDER_SITE_OTHER)
Admission: RE | Admit: 2014-09-27 | Discharge: 2014-09-27 | Disposition: A | Discharge status: Home / Self Care | Payer: BLUE CROSS/BLUE SHIELD | Source: Ambulatory Visit | Attending: Physician Assistant | Admitting: Physician Assistant

## 2014-09-27 DIAGNOSIS — R109 Unspecified abdominal pain: Secondary | ICD-10-CM | POA: Diagnosis not present

## 2014-09-27 MED ORDER — IOHEXOL 300 MG/ML  SOLN
100.0000 mL | Freq: Once | INTRAMUSCULAR | Status: AC | PRN
  Administered 2014-09-27: 100 mL via INTRAVENOUS

## 2014-09-29 ENCOUNTER — Other Ambulatory Visit: Payer: Self-pay

## 2014-09-29 DIAGNOSIS — R1030 Lower abdominal pain, unspecified: Secondary | ICD-10-CM

## 2014-09-29 DIAGNOSIS — K561 Intussusception: Secondary | ICD-10-CM

## 2014-09-30 ENCOUNTER — Ambulatory Visit: Payer: BLUE CROSS/BLUE SHIELD | Admitting: Internal Medicine

## 2014-09-30 NOTE — Progress Notes (Signed)
Patient here for capsule endo teaching. Verbalizes understanding for all written and verbal instructions. 

## 2014-10-05 ENCOUNTER — Ambulatory Visit (INDEPENDENT_AMBULATORY_CARE_PROVIDER_SITE_OTHER): Payer: BLUE CROSS/BLUE SHIELD | Admitting: Internal Medicine

## 2014-10-05 DIAGNOSIS — R109 Unspecified abdominal pain: Secondary | ICD-10-CM

## 2014-10-05 DIAGNOSIS — R933 Abnormal findings on diagnostic imaging of other parts of digestive tract: Secondary | ICD-10-CM

## 2014-10-05 NOTE — Progress Notes (Signed)
Patient here for capsule endoscopy. Tolerated procedure. Verbalizes understanding of written and verbal instructions. Lot [id number] exp 2017-09

## 2014-10-10 ENCOUNTER — Telehealth: Payer: Self-pay | Admitting: Internal Medicine

## 2014-10-10 NOTE — Telephone Encounter (Signed)
Pt calling for capsule endo results. Left message for pt to call back.

## 2014-10-12 NOTE — Telephone Encounter (Signed)
Possible intermittent intussusception Normal video capsule endoscopy If recurrent attacks of pain given CT findings would refer to [facility] for surgical opinion

## 2014-10-12 NOTE — Telephone Encounter (Signed)
Pt states that Saturday he ate a piece of cake and a slice of pizza and Sunday morning he was having terrible stomach pain. States he is better now. Pt wanting to know results of capsule endo. Please advise.

## 2014-10-13 NOTE — Telephone Encounter (Signed)
Spoke with pt and he is aware. Pt states he will call back if the pain persists and then referral can be made to [facility].

## 2014-10-14 ENCOUNTER — Encounter: Payer: Self-pay | Admitting: Internal Medicine

## 2014-10-15 ENCOUNTER — Emergency Department (HOSPITAL_COMMUNITY)
Admission: EM | Admit: 2014-10-15 | Discharge: 2014-10-15 | Disposition: A | Discharge status: Home / Self Care | Payer: BLUE CROSS/BLUE SHIELD | Attending: Emergency Medicine | Admitting: Emergency Medicine

## 2014-10-15 ENCOUNTER — Encounter (HOSPITAL_COMMUNITY): Payer: Self-pay | Admitting: Emergency Medicine

## 2014-10-15 ENCOUNTER — Emergency Department (HOSPITAL_COMMUNITY): Payer: BLUE CROSS/BLUE SHIELD

## 2014-10-15 DIAGNOSIS — K219 Gastro-esophageal reflux disease without esophagitis: Secondary | ICD-10-CM | POA: Diagnosis not present

## 2014-10-15 DIAGNOSIS — W25XXXA Contact with sharp glass, initial encounter: Secondary | ICD-10-CM | POA: Insufficient documentation

## 2014-10-15 DIAGNOSIS — S61411A Laceration without foreign body of right hand, initial encounter: Secondary | ICD-10-CM

## 2014-10-15 DIAGNOSIS — Y998 Other external cause status: Secondary | ICD-10-CM | POA: Insufficient documentation

## 2014-10-15 DIAGNOSIS — Z79899 Other long term (current) drug therapy: Secondary | ICD-10-CM | POA: Insufficient documentation

## 2014-10-15 DIAGNOSIS — Y9389 Activity, other specified: Secondary | ICD-10-CM | POA: Insufficient documentation

## 2014-10-15 DIAGNOSIS — Z87891 Personal history of nicotine dependence: Secondary | ICD-10-CM | POA: Diagnosis not present

## 2014-10-15 DIAGNOSIS — S6991XA Unspecified injury of right wrist, hand and finger(s), initial encounter: Secondary | ICD-10-CM | POA: Diagnosis present

## 2014-10-15 DIAGNOSIS — Y9289 Other specified places as the place of occurrence of the external cause: Secondary | ICD-10-CM | POA: Diagnosis not present

## 2014-10-15 MED ORDER — LIDOCAINE HCL (PF) 1 % IJ SOLN
30.0000 mL | Freq: Once | INTRAMUSCULAR | Status: AC
  Administered 2014-10-15: 30 mL via INTRADERMAL
  Filled 2014-10-15: qty 30

## 2014-10-15 NOTE — ED Provider Notes (Signed)
CSN: [id number]     Arrival date & time 10/15/14  0723 History   First MD Initiated Contact with Patient 10/15/14 [phone]     Chief Complaint  Patient presents with  . Hand Injury     (Consider location/radiation/quality/duration/timing/severity/associated sxs/prior Treatment) Patient is a 29 y.o. male presenting with hand injury. The history is provided by the patient. No language interpreter was used.  Hand Injury Location:  Hand Time since incident:  2 hours Injury: yes   Mechanism of injury: fall   Fall:    Fall occurred:  Tripped (holding an empty glass flask which cut his hand)   Height of fall:  Standing height   Point of impact:  Hands Hand location:  R hand Pain details:    Quality:  Sharp   Radiates to:  Does not radiate   Severity:  Moderate   Onset quality:  Sudden   Duration:  2 hours   Timing:  Constant   Progression:  Improving Chronicity:  New Handedness:  Right-handed Dislocation: no   Foreign body present:  No foreign bodies Tetanus status:  Up to date Prior injury to area:  No Associated symptoms: no back pain and no fever     Past Medical History  Diagnosis Date  . Acid reflux    Past Surgical History  Procedure Laterality Date  . Knee surgery Left     football injury  . Tonsillectomy and adenoidectomy    . Tubes in ears Bilateral     age [age]  . Colonoscopy N/A 06/02/2013    Procedure: COLONOSCOPY;  Surgeon: Beverley Fiedler, MD;  Location: [facility];  Service: Gastroenterology;  Laterality: N/A;   Family History  Problem Relation Age of Onset  . Ulcerative colitis Father   . Colon cancer Maternal Uncle 48  . Heart attack Paternal Grandfather     in his 74's   History  Substance Use Topics  . Smoking status: Former Smoker    Types: Cigarettes  . Smokeless tobacco: Current User    Types: Chew  . Alcohol Use: No    Review of Systems  Constitutional: Negative for fever and chills.  HENT: Negative for mouth sores and sore throat.    Eyes: Negative for visual disturbance.  Respiratory: Negative for cough and shortness of breath.   Cardiovascular: Negative for chest pain.  Gastrointestinal: Negative for nausea, vomiting and abdominal pain.  Endocrine: Negative for polyuria.  Genitourinary: Negative for dysuria and flank pain.  Musculoskeletal: Negative for myalgias and back pain.  Skin: Negative for rash.  Neurological: Negative for weakness and headaches.  Hematological: Does not bruise/bleed easily.  Psychiatric/Behavioral: Negative for behavioral problems and agitation.  All other systems reviewed and are negative.     Allergies  Review of patient's allergies indicates no known allergies.  Home Medications   Prior to Admission medications   Medication Sig Start Date End Date Taking? Authorizing Provider  diltiazem 2 % GEL Apply gel 3-4 times daily for 2 months. 09/22/14   Amy S Esterwood, PA-C  esomeprazole (NEXIUM) 40 MG capsule Take 40 mg by mouth daily at 12 noon.    Historical Provider, MD  Lidocaine, Anorectal, 5 % GEL Apply 1 application topically 4 (four) times daily. 09/22/14   Amy S Esterwood, PA-C   BP 102/85 mmHg  Pulse 75  Temp(Src) 98.8 F (37.1 C) (Oral)  Resp 19  Ht 6' (1.829 m)  Wt 160 lb (72.576 kg)  BMI 21.70 kg/m2  SpO2 100% Physical  Exam  Constitutional: He is oriented to person, place, and time. He appears well-developed and well-nourished. No distress.  HENT:  Head: Normocephalic and atraumatic.  Mouth/Throat: Oropharynx is clear and moist. No oropharyngeal exudate.  Eyes: Conjunctivae are normal. Pupils are equal, round, and reactive to light.  Neck: Normal range of motion. Neck supple.  Cardiovascular: Normal rate, regular rhythm, normal heart sounds and intact distal pulses.  Exam reveals no gallop and no friction rub.   No murmur heard. Pulmonary/Chest: Effort normal and breath sounds normal. No respiratory distress. He has no wheezes.  Abdominal: Soft. He exhibits no  distension and no mass. There is no tenderness. There is no rebound and no guarding.  Musculoskeletal: Normal range of motion. He exhibits no edema or tenderness.  Good strength in R hand. Cut is 3cm in the web space between index and thumb. Normal sensation. No foreign bodies.  Neurological: He is alert and oriented to person, place, and time.  Skin: Skin is warm and dry. He is not diaphoretic.  Psychiatric: He has a normal mood and affect. His behavior is normal.  Nursing note and vitals reviewed.   ED Course  Procedures (including critical care time) Labs Review Labs Reviewed - No data to display  Imaging Review Dg Hand Complete Right  10/15/2014   CLINICAL DATA:  Hand injury.  Hand laceration.  EXAM: RIGHT HAND - COMPLETE 3+ VIEW  COMPARISON:  None.  FINDINGS: There is no evidence of fracture or dislocation. There is no evidence of arthropathy or other focal bone abnormality. Soft tissues are unremarkable. Bandage material is present in the first webspace.  IMPRESSION: Negative.   Electronically Signed   By: Andreas Newport M.D.   On: 10/15/2014 08:08     EKG Interpretation None      MDM   Final diagnoses:  None    Aaron Waters is a 28yo who presents with a laceration to his R hand in the web space between his index and thumb after falling with an empty glass flask in his hand 2 hours ago. Xray and exam shows no fracture or foreign body. Normal strength, ROM and sensation in affected hand. UTD on tetanus. Will suture lac here today.    Darrick Huntsman, MD 10/15/14 1610  Elwin Mocha, MD 10/15/14 1149

## 2014-10-15 NOTE — ED Notes (Signed)
Pt. slipped and fell while holding a glass flask at work this morning , presents with lacerations at right palm with moderate bleeding - dressing applied at triage . No LOC .

## 2014-10-15 NOTE — ED Notes (Signed)
Suture cart at bedside 

## 2014-10-15 NOTE — ED Provider Notes (Signed)
  Physical Exam  BP 134/88 mmHg  Pulse 81  Temp(Src) 98.8 F (37.1 C) (Oral)  Resp 19  Ht 6' (1.829 m)  Wt 160 lb (72.576 kg)  BMI 21.70 kg/m2  SpO2 97%  Physical Exam  Constitutional: He is oriented to person, place, and time. He appears well-developed and well-nourished. No distress.  HENT:  Head: Normocephalic and atraumatic.  Eyes: Right eye exhibits no discharge. Left eye exhibits no discharge. No scleral icterus.  Neck: Normal range of motion.  Pulmonary/Chest: Effort normal. No respiratory distress.  Musculoskeletal: Normal range of motion.  Neurological: He is alert and oriented to person, place, and time.  Skin: Skin is warm and dry. He is not diaphoretic.  5-6 cm laceration noted extending from the radial border of the webspace of the thumb extending into the thenar eminence.  Psychiatric: He has a normal mood and affect.  Nursing note and vitals reviewed.   ED Course  LACERATION REPAIR Date/Time: 10/15/2014 11:32 AM Performed by: Ladona Mow Authorized by: Ladona Mow Consent: Verbal consent obtained. Risks and benefits: risks, benefits and alternatives were discussed Consent given by: patient Patient identity confirmed: verbally with patient Time out: Immediately prior to procedure a "time out" was called to verify the correct patient, procedure, equipment, support staff and site/side marked as required. Body area: upper extremity Location details: right hand Laceration length: 5 cm Foreign bodies: no foreign bodies Tendon involvement: none Nerve involvement: none Vascular damage: no Anesthesia: local infiltration Local anesthetic: lidocaine 1% without epinephrine Anesthetic total: 20 ml Patient sedated: no Preparation: Patient was prepped and draped in the usual sterile fashion. Irrigation solution: saline Irrigation method: jet lavage Amount of cleaning: standard Debridement: minimal Degree of undermining: none Skin closure: 5-0 Prolene Subcutaneous  closure: 4-0 Vicryl Number of sutures: 12 Technique: simple Approximation: close Approximation difficulty: complex Patient tolerance: Patient tolerated the procedure well with no immediate complications Comments: 4-0 Vicryls x2 subcutaneous; 4-0 proline 10 superficial    MDM I was asked to perform laceration repair by Dr. Elwin Mocha, MD.  Wound repaired as in procedure note above.  Tdap booster given. Wound cleaning complete with pressure irrigation, bottom of wound visualized, no foreign bodies appreciated. Laceration occurred < 8 hours prior to repair which was well tolerated. Pt has no co morbidities to effect normal wound healing. Discussed suture home care w pt and answered questions. Pt to f-u for wound check and suture removal in 7 days. Pt is hemodynamically stable w no complaints prior to dc.   Signed,  Ladona Mow, PA-C 11:36 AM       Ladona Mow, PA-C 10/15/14 1136  Elwin Mocha, MD 10/15/14 [phone]

## 2014-10-26 ENCOUNTER — Encounter: Payer: Self-pay | Admitting: *Deleted

## 2014-11-30 ENCOUNTER — Ambulatory Visit: Payer: BLUE CROSS/BLUE SHIELD | Admitting: Internal Medicine

## 2015-01-10 ENCOUNTER — Telehealth: Payer: Self-pay | Admitting: Internal Medicine

## 2015-01-10 NOTE — Telephone Encounter (Signed)
Left message for pt to call back  °

## 2015-01-11 NOTE — Telephone Encounter (Signed)
Left message for pt to call back  °

## 2015-01-12 NOTE — Telephone Encounter (Signed)
Pt states he is still having rectal bleeding and that it is worse that is has been. States the blood is bright red. Pt scheduled to see Doug SouJessica Zehr PA 01/18/15@3pm . Pt aware of appt.

## 2015-01-18 ENCOUNTER — Encounter: Payer: Self-pay | Admitting: Gastroenterology

## 2015-01-18 ENCOUNTER — Ambulatory Visit (INDEPENDENT_AMBULATORY_CARE_PROVIDER_SITE_OTHER): Payer: BLUE CROSS/BLUE SHIELD | Admitting: Gastroenterology

## 2015-01-18 VITALS — BP 100/60 | HR 72 | Ht 72.0 in | Wt 265.6 lb

## 2015-01-18 DIAGNOSIS — K602 Anal fissure, unspecified: Secondary | ICD-10-CM

## 2015-01-18 DIAGNOSIS — K625 Hemorrhage of anus and rectum: Secondary | ICD-10-CM | POA: Diagnosis not present

## 2015-01-18 MED ORDER — NITROGLYCERIN 0.4 % RE OINT: 1.0000 "application " | TOPICAL_OINTMENT | Freq: Two times a day (BID) | RECTAL | Status: AC

## 2015-01-18 NOTE — Patient Instructions (Signed)
You have been scheduled for an appointment with Dr Romie LeveeAlicia Thomas at [facility]. Your appointment is on 02-06-2015. Please arrive at 8:30am for registration. Make certain to bring a list of current medications, including any over the counter medications or vitamins. Also bring your co-pay if you have one as well as your insurance cards. [facility] is located at [address], [address]. Should you need to reschedule your appointment, please contact them at [phone].   We have sent the following medications to your pharmacy for you to pick up at your convenience: Rx for Nitroglycerin gel [facility].   Please use over the counter Recticare lidocaine.

## 2015-01-18 NOTE — Progress Notes (Addendum)
     01/18/2015 Rhona LeavensDouglas F Thomaston [id number] 04/10/1985   History of Present Illness:  This is a 29 year old male who is known to Dr. Rhea BeltonPyrtle.  He had a colonoscopy in March 2015 for complaints of rectal bleeding, which was a normal exam; biopsies were taken throughout the colon and were benign.  He was seen in July and examined thoroughly and found to have an anal fissure.  Has been using diltiazem gel TID since July until he ran out 3 weeks ago without any improvement.  He says he continues to see some blood every time he has a bowel movement.  Having blood in his underwear at times as well.  Still with anal discomfort.  Current Medications, Allergies, Past Medical History, Past Surgical History, Family History and Social History were reviewed in [facility] electronic medical record.   Physical Exam: BP 100/60 mmHg  Pulse 72  Ht 6' (1.829 m)  Wt 265 lb 9.6 oz (120.475 kg)  BMI 36.01 kg/m2 General: Well developed white male in no acute distress Head: Normocephalic and atraumatic Eyes:  Sclerae anicteric, conjunctiva pink  Ears: Normal auditory acuity. Rectal:  Not examined today as he had a thorough exam at last visit in July. Musculoskeletal: Symmetrical with no gross deformities  Extremities: No edema  Neurological: Alert oriented x 4, grossly non-focal Psychological:  Alert and cooperative. Normal mood and affect  Assessment and Recommendations: -Anal fissure with ongoing bleeding and discomfort:  No improvement with months of diltiazem.  Will try Nitroglycerin gel BID but will also schedule to be evaluated by surgeon at [facility].  Can use OTC lidocaine gel prn for now as well if he wishes.  Addendum: Reviewed and agree with ongoing management including surgical referral for nonhealing anal fissure. Beverley FiedlerJay M Pyrtle, MD

## 2015-12-17 IMAGING — DX DG HAND COMPLETE 3+V*R*
3 series · 3 of 3 positions shown · non-contrast
Comparison: None.

CLINICAL DATA: Hand injury.  Hand laceration.

EXAM:
RIGHT HAND - COMPLETE 3+ VIEW

[hand pa]
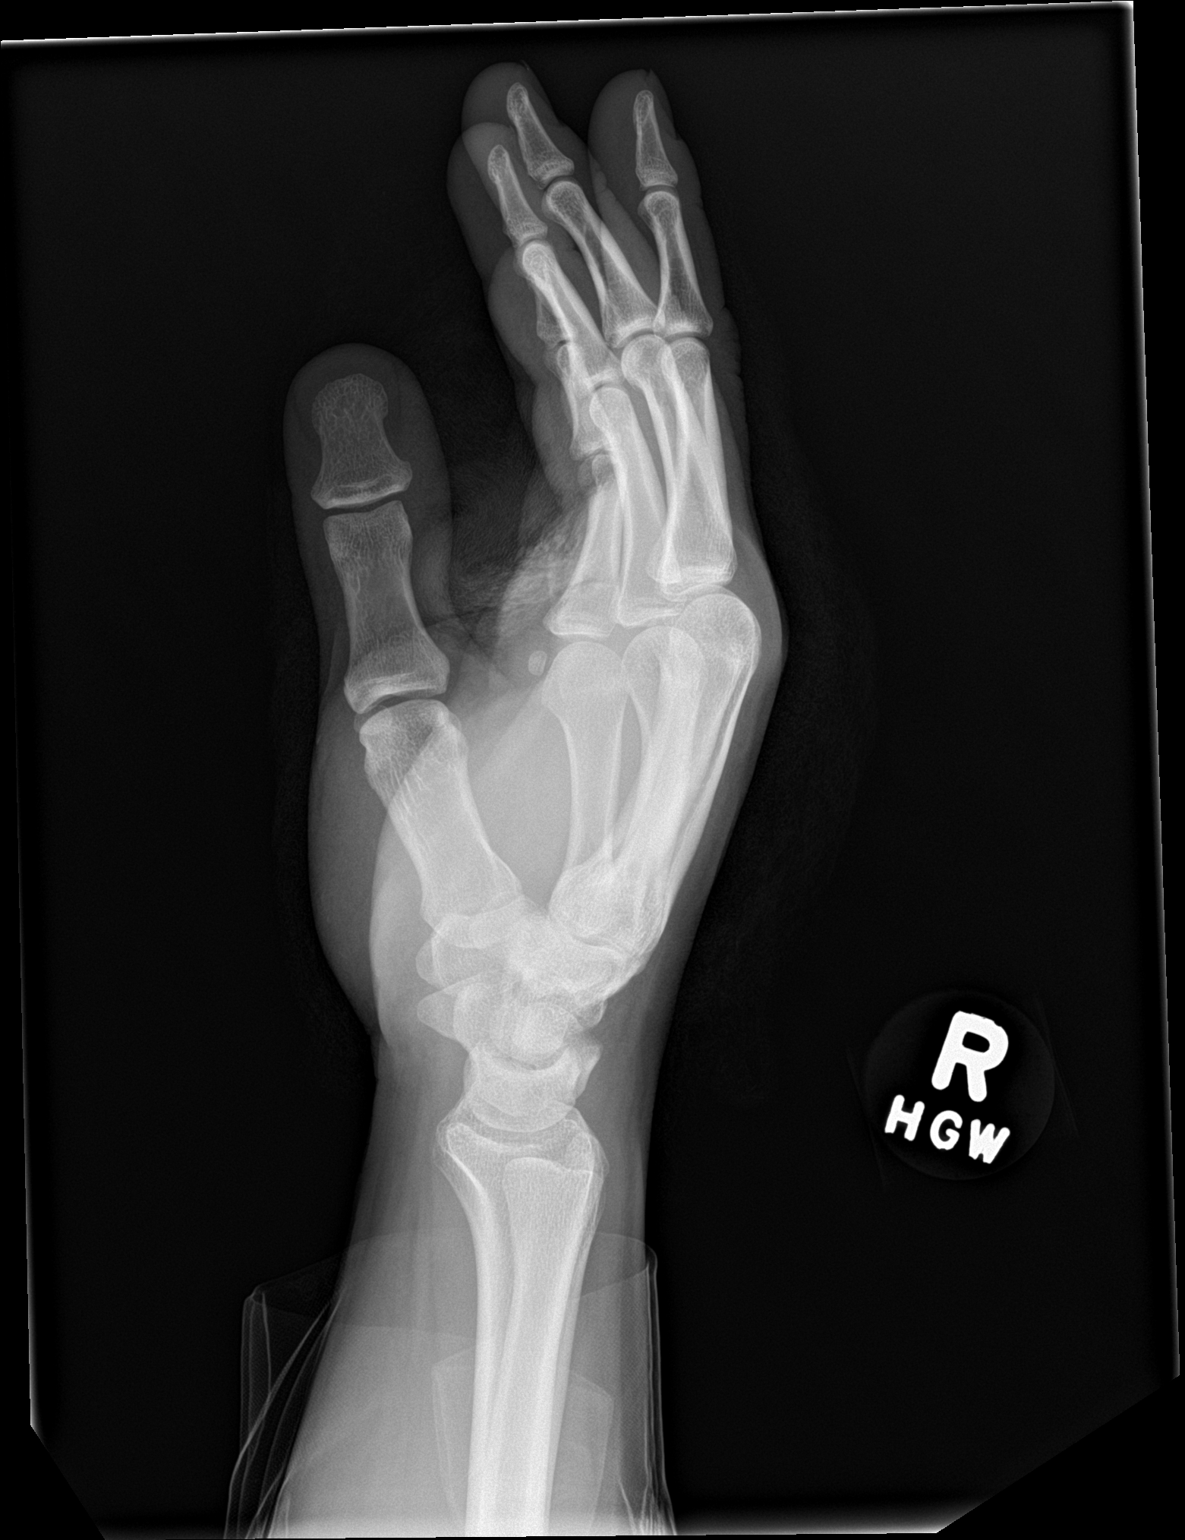

[hand obl]
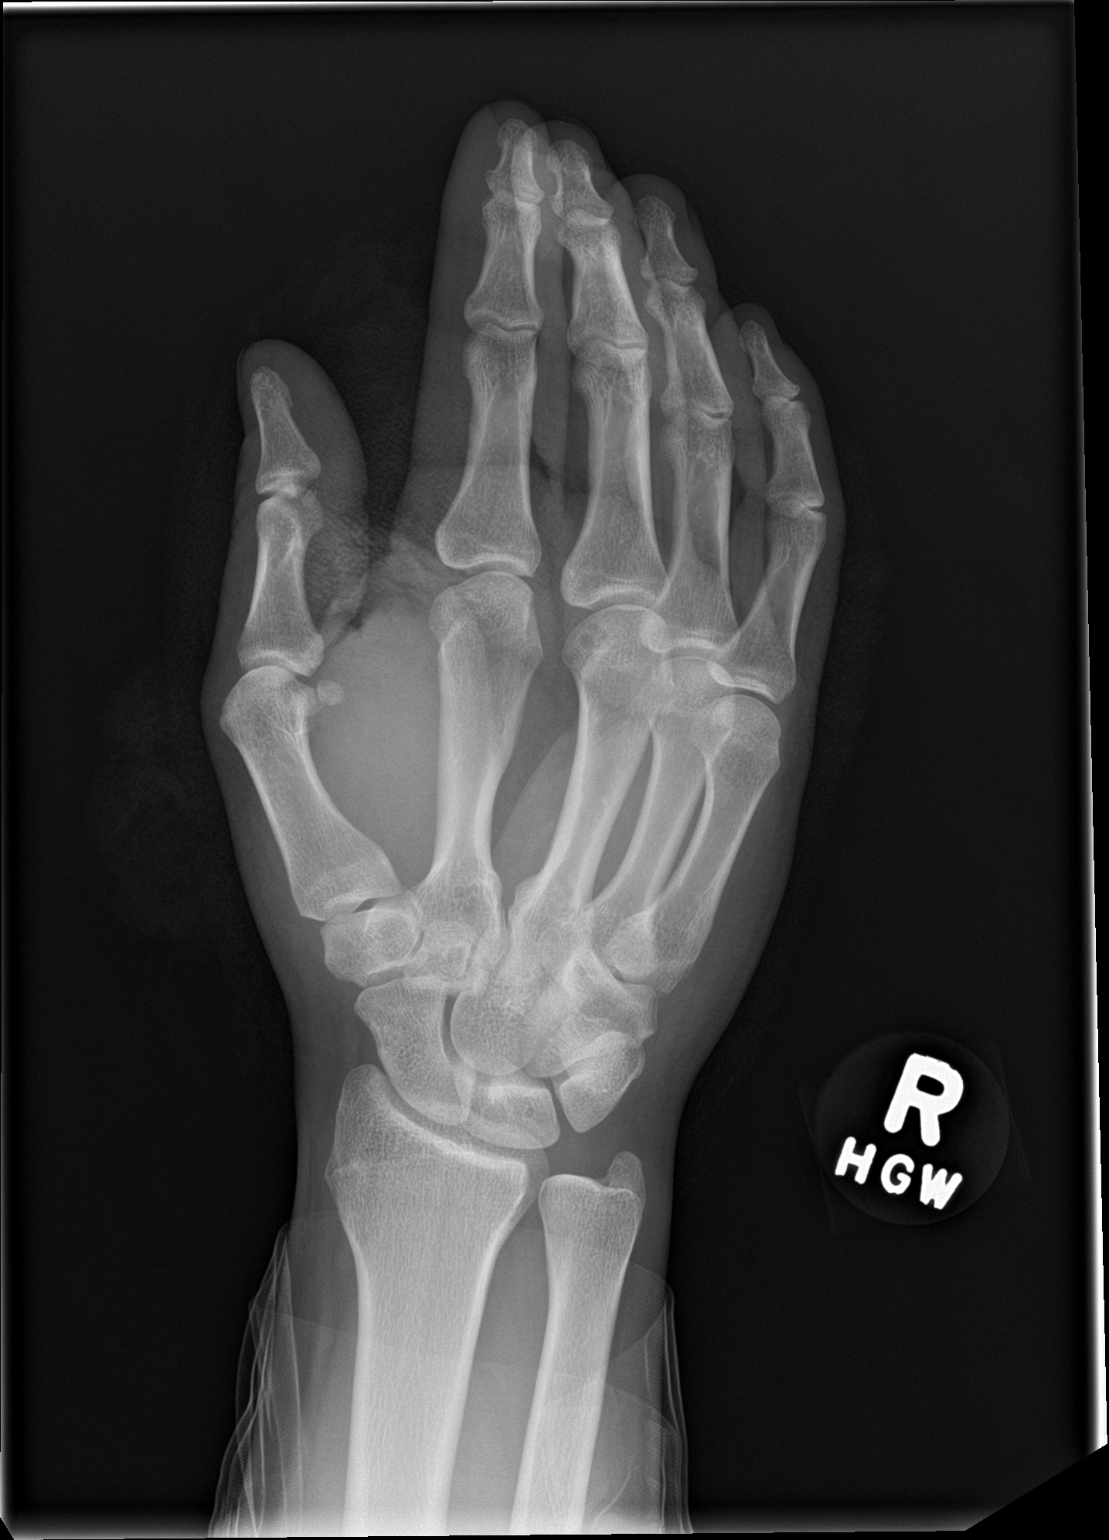

[hand lat]
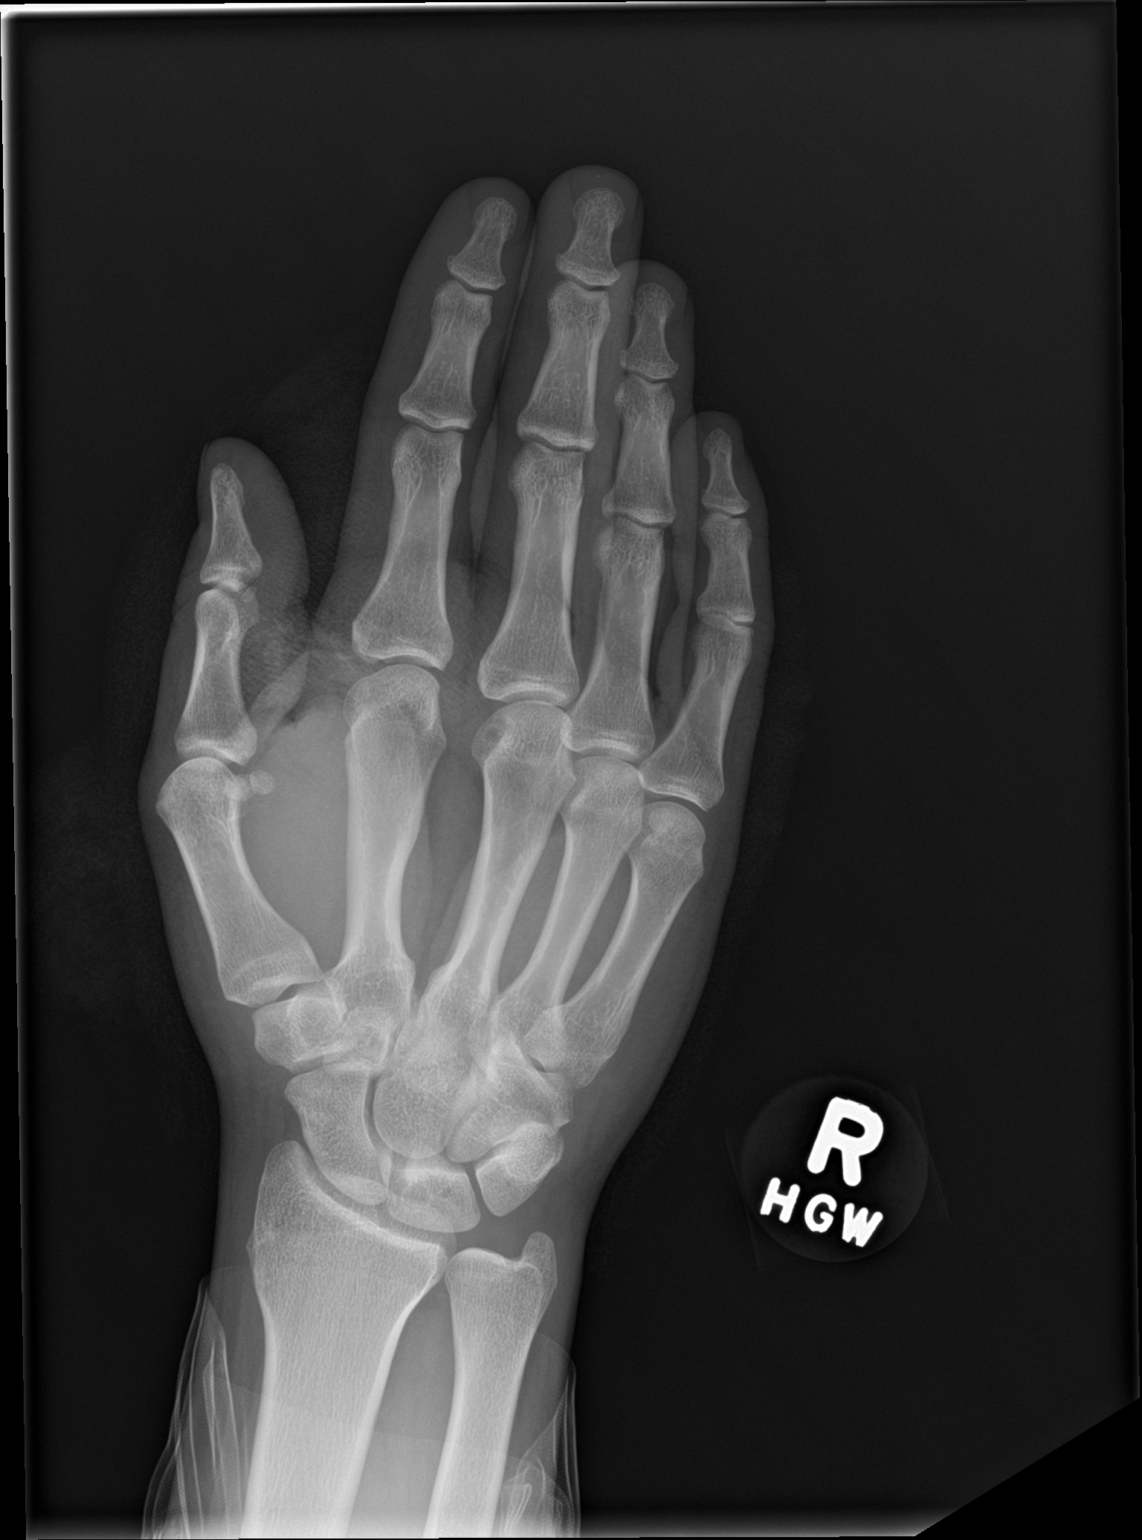

[3 of 3 positions shown; findings below may reference images not displayed]

FINDINGS: There is no evidence of fracture or dislocation. There is no
evidence of arthropathy or other focal bone abnormality. Soft
tissues are unremarkable. Bandage material is present in the first
webspace.
IMPRESSION: Negative.

## 2016-01-29 ENCOUNTER — Emergency Department (HOSPITAL_COMMUNITY): Payer: BLUE CROSS/BLUE SHIELD

## 2016-01-29 ENCOUNTER — Emergency Department (HOSPITAL_COMMUNITY)
Admission: EM | Admit: 2016-01-29 | Discharge: 2016-01-29 | Disposition: A | Discharge status: Home / Self Care | Payer: BLUE CROSS/BLUE SHIELD | Attending: Emergency Medicine | Admitting: Emergency Medicine

## 2016-01-29 ENCOUNTER — Encounter (HOSPITAL_COMMUNITY): Payer: Self-pay | Admitting: Emergency Medicine

## 2016-01-29 DIAGNOSIS — R42 Dizziness and giddiness: Secondary | ICD-10-CM | POA: Insufficient documentation

## 2016-01-29 DIAGNOSIS — R0789 Other chest pain: Secondary | ICD-10-CM | POA: Diagnosis present

## 2016-01-29 DIAGNOSIS — J189 Pneumonia, unspecified organism: Secondary | ICD-10-CM | POA: Insufficient documentation

## 2016-01-29 DIAGNOSIS — F17220 Nicotine dependence, chewing tobacco, uncomplicated: Secondary | ICD-10-CM | POA: Insufficient documentation

## 2016-01-29 DIAGNOSIS — H9203 Otalgia, bilateral: Secondary | ICD-10-CM | POA: Diagnosis not present

## 2016-01-29 LAB — COMPREHENSIVE METABOLIC PANEL
ALK PHOS: 68 U/L (ref 38–126)
ALT: 21 U/L (ref 17–63)
AST: 21 U/L (ref 15–41)
Albumin: 4.6 g/dL (ref 3.5–5.0)
Anion gap: 7 (ref 5–15)
BUN: 12 mg/dL (ref 6–20)
CHLORIDE: 107 mmol/L (ref 101–111)
CO2: 23 mmol/L (ref 22–32)
CREATININE: 1.05 mg/dL (ref 0.61–1.24)
Calcium: 9.3 mg/dL (ref 8.9–10.3)
GFR calc Af Amer: >60 mL/min (ref >60)
GFR calc non Af Amer: >60 mL/min (ref >60)
GLUCOSE: 102 mg/dL — AB (ref 65–99)
Potassium: 3.9 mmol/L (ref 3.5–5.1)
SODIUM: 137 mmol/L (ref 135–145)
Total Bilirubin: 0.9 mg/dL (ref 0.3–1.2)
Total Protein: 7.8 g/dL (ref 6.5–8.1)

## 2016-01-29 LAB — URINALYSIS, ROUTINE W REFLEX MICROSCOPIC
BILIRUBIN URINE: NEGATIVE
GLUCOSE, UA: NEGATIVE mg/dL
HGB URINE DIPSTICK: NEGATIVE
Ketones, ur: NEGATIVE mg/dL
Leukocytes, UA: NEGATIVE
Nitrite: NEGATIVE
PROTEIN: NEGATIVE mg/dL
Specific Gravity, Urine: 1.02 (ref 1.005–1.030)
pH: 6 (ref 5.0–8.0)

## 2016-01-29 LAB — LIPASE, BLOOD: LIPASE: 20 U/L (ref 11–51)

## 2016-01-29 LAB — CBC
HCT: 45.2 % (ref 39.0–52.0)
Hemoglobin: 15.8 g/dL (ref 13.0–17.0)
MCH: 30.7 pg (ref 26.0–34.0)
MCHC: 35 g/dL (ref 30.0–36.0)
MCV: 87.8 fL (ref 78.0–100.0)
PLATELETS: 220 10*3/uL (ref 150–400)
RBC: 5.15 MIL/uL (ref 4.22–5.81)
RDW: 12.7 % (ref 11.5–15.5)
WBC: 7.5 10*3/uL (ref 4.0–10.5)

## 2016-01-29 LAB — TROPONIN I: Troponin I: <0.03 ng/mL (ref <0.03)

## 2016-01-29 MED ORDER — AZITHROMYCIN 250 MG PO TABS: ORAL_TABLET | ORAL | 0 refills | Status: AC

## 2016-01-29 MED ORDER — SODIUM CHLORIDE 0.9 % IV BOLUS (SEPSIS)
1000.0000 mL | Freq: Once | INTRAVENOUS | Status: AC
  Administered 2016-01-29: 1000 mL via INTRAVENOUS

## 2016-01-29 MED ORDER — KETOROLAC TROMETHAMINE 30 MG/ML IJ SOLN
30.0000 mg | Freq: Once | INTRAMUSCULAR | Status: AC
  Administered 2016-01-29: 30 mg via INTRAVENOUS
  Filled 2016-01-29: qty 1

## 2016-01-29 MED ORDER — ONDANSETRON HCL 4 MG/2ML IJ SOLN
4.0000 mg | Freq: Once | INTRAMUSCULAR | Status: AC
  Administered 2016-01-29: 4 mg via INTRAVENOUS
  Filled 2016-01-29: qty 2

## 2016-01-29 MED ORDER — ONDANSETRON HCL 8 MG PO TABS: 8.0000 mg | ORAL_TABLET | ORAL | 0 refills | Status: AC | PRN

## 2016-01-29 NOTE — ED Provider Notes (Signed)
[facility]-EM[facility]Y DEPT Provider Note   CSN: [id number] Arrival date & time: 01/29/16  1056  By signing my name below, I, Majel HomerPeyton Lee, attest that this documentation has been prepared under the direction and in the presence of Geoffery Lyonsouglas Dejah Droessler, MD . Electronically Signed: Majel HomerPeyton Lee, Scribe. 01/29/2016. 11:37 AM.  History   Chief Complaint Chief Complaint  Patient presents with  . Emesis   The history is provided by the patient. No language interpreter was used.   HPI Comments: Aaron Waters is a 30 y.o. male with PMHx of GERD, who presents to the Emergency Department complaining of gradually worsening, chest tightness and dizziness that began yesterday. Pt's mom reports associated bilateral ear "ringing" yesterday afternoon and multiple episodes of vomiting and diarrhea that began last night. Per girlfriend, pt had similar symptoms of vomiting and diarrhea last week; however, it only lasted for one day before it resolved on its own. Pt's mom notes he has blood in his stool at baseline "due to an internal hemorrhoid." Pt denies hematemesis, abdominal pain, fever, sick contacts and PSHx to his abdomen.   Past Medical History:  Diagnosis Date  . Acid reflux   . External hemorrhoid     Patient Active Problem List   Diagnosis Date Noted  . Anal fissure 01/18/2015  . Rectal bleeding 01/18/2015  . GERD (gastroesophageal reflux disease) 05/28/2013    Past Surgical History:  Procedure Laterality Date  . COLONOSCOPY N/A 06/02/2013   Procedure: COLONOSCOPY;  Surgeon: Beverley FiedlerJay M Pyrtle, MD;  Location: [facility];  Service: Gastroenterology;  Laterality: N/A;  . KNEE SURGERY Left    football injury  . TONSILLECTOMY AND ADENOIDECTOMY    . tubes in ears Bilateral    age [age]    Home Medications    Prior to Admission medications   Medication Sig Start Date End Date Taking? Authorizing Provider  esomeprazole (NEXIUM) 40 MG capsule Take 40 mg by mouth daily at 12 noon.    Historical Provider,  MD  Nitroglycerin 0.4 % OINT Place 1 application rectally 2 (two) times daily. 01/18/15   Leta BaptistJessica D Zehr, PA-C    Family History Family History  Problem Relation Age of Onset  . Ulcerative colitis Father   . Colon cancer Maternal Uncle 48  . Heart attack Paternal Grandfather     in his 3840's    Social History Social History  Substance Use Topics  . Smoking status: Former Smoker    Types: Cigarettes  . Smokeless tobacco: Current User    Types: Chew  . Alcohol use No     Allergies   Patient has no known allergies.   Review of Systems Review of Systems  Constitutional: Negative for fever.  HENT: Positive for ear pain.   Respiratory: Positive for chest tightness.   Gastrointestinal: Positive for diarrhea and vomiting. Negative for abdominal pain.  Neurological: Positive for dizziness.   Physical Exam Updated Vital Signs BP 116/77 (BP Location: Left Arm)   Pulse 116   Temp 99.3 F (37.4 C) (Oral)   Resp 16   Ht 6' (1.829 m)   Wt 260 lb (117.9 kg)   SpO2 97%   BMI 35.26 kg/m   Physical Exam  Constitutional: He is oriented to person, place, and time. He appears well-developed and well-nourished.  HENT:  Head: Normocephalic and atraumatic.  Eyes: EOM are normal.  Neck: Normal range of motion.  Cardiovascular: Normal rate, regular rhythm, normal heart sounds and intact distal pulses.   Pulmonary/Chest: Effort normal  and breath sounds normal. No respiratory distress.  Abdominal: Soft. He exhibits no distension. There is no tenderness.  Musculoskeletal: Normal range of motion.  Neurological: He is alert and oriented to person, place, and time.  Skin: Skin is warm and dry.  Psychiatric: He has a normal mood and affect. Judgment normal.  Nursing note and vitals reviewed.  ED Treatments / Results  Labs (all labs ordered are listed, but only abnormal results are displayed) Labs Reviewed  LIPASE, BLOOD  COMPREHENSIVE METABOLIC PANEL  CBC  URINALYSIS, ROUTINE W  REFLEX MICROSCOPIC (NOT AT [facility])    EKG  EKG Interpretation None       Radiology No results found.  Procedures Procedures (including critical care time)  Medications Ordered in ED Medications - No data to display  DIAGNOSTIC STUDIES:  Oxygen Saturation is 97% on RA, normal by my interpretation.    COORDINATION OF CARE:  11:35 AM Discussed treatment plan with pt at bedside and pt agreed to plan.  Initial Impression / Assessment and Plan / ED Course  I have reviewed the triage vital signs and the nursing notes.  Pertinent labs & imaging results that were available during my care of the patient were reviewed by me and considered in my medical decision making (see chart for details).  Clinical Course     Patient presents with dizziness, vomiting, diarrhea. He also reports chest discomfort. His EKG shows nonspecific T wave abnormalities, however troponin is negative and there are no old studies for comparison. I highly doubt a cardiac etiology. Workup reveals no laboratory study abnormalities, however chest x-ray is suggestive of a right lower lobe pneumonia. I suspect this is the cause of his discomfort. This will be treated with Zithromax and he will be given anti-emetics. To return as needed for any period.  I personally performed the services described in this documentation, which was scribed in my presence. The recorded information has been reviewed and is accurate.   Final Clinical Impressions(s) / ED Diagnoses   Final diagnoses:  None    New Prescriptions New Prescriptions   No medications on file     Geoffery Lyonsouglas Jadrian Bulman, MD 01/29/16 1551

## 2016-01-29 NOTE — ED Notes (Signed)
Patient walked to the bathroom with minimal assistance.  

## 2016-01-29 NOTE — Discharge Instructions (Signed)
Zithromax as prescribed.  Zofran as prescribed as needed for nausea.  Clear liquid diet for the next 12 hours, then slowly advance to normal.  Return to the emergency department if you develop worsening chest pain, high fevers, difficulty breathing, or other new and concerning symptoms.

## 2016-01-29 NOTE — ED Notes (Signed)
Pt unable to provide urine specimen.  

## 2016-01-29 NOTE — ED Notes (Signed)
Pt reports N/V/D/ and CP that started last night. States his last episode of urine was yesterday afternoon, pt is lethargic and has a weak cough.

## 2016-01-29 NOTE — ED Triage Notes (Signed)
Pt reports n/v/d and chest discomfort that started last night.

## 2016-01-29 NOTE — ED Notes (Signed)
Pt unable to to provide urine specimen at this time.

## 2016-01-29 NOTE — ED Notes (Signed)
Pt reports decrease in N/ and dizziness. No episodes of V/ since arrival.

## 2017-04-01 IMAGING — DX DG CHEST 2V
2 series · 2 of 2 positions shown · non-contrast
Comparison: October 06, 2008

CLINICAL DATA: Chest pain and vomiting

EXAM:
CHEST  2 VIEW

[chest pa]
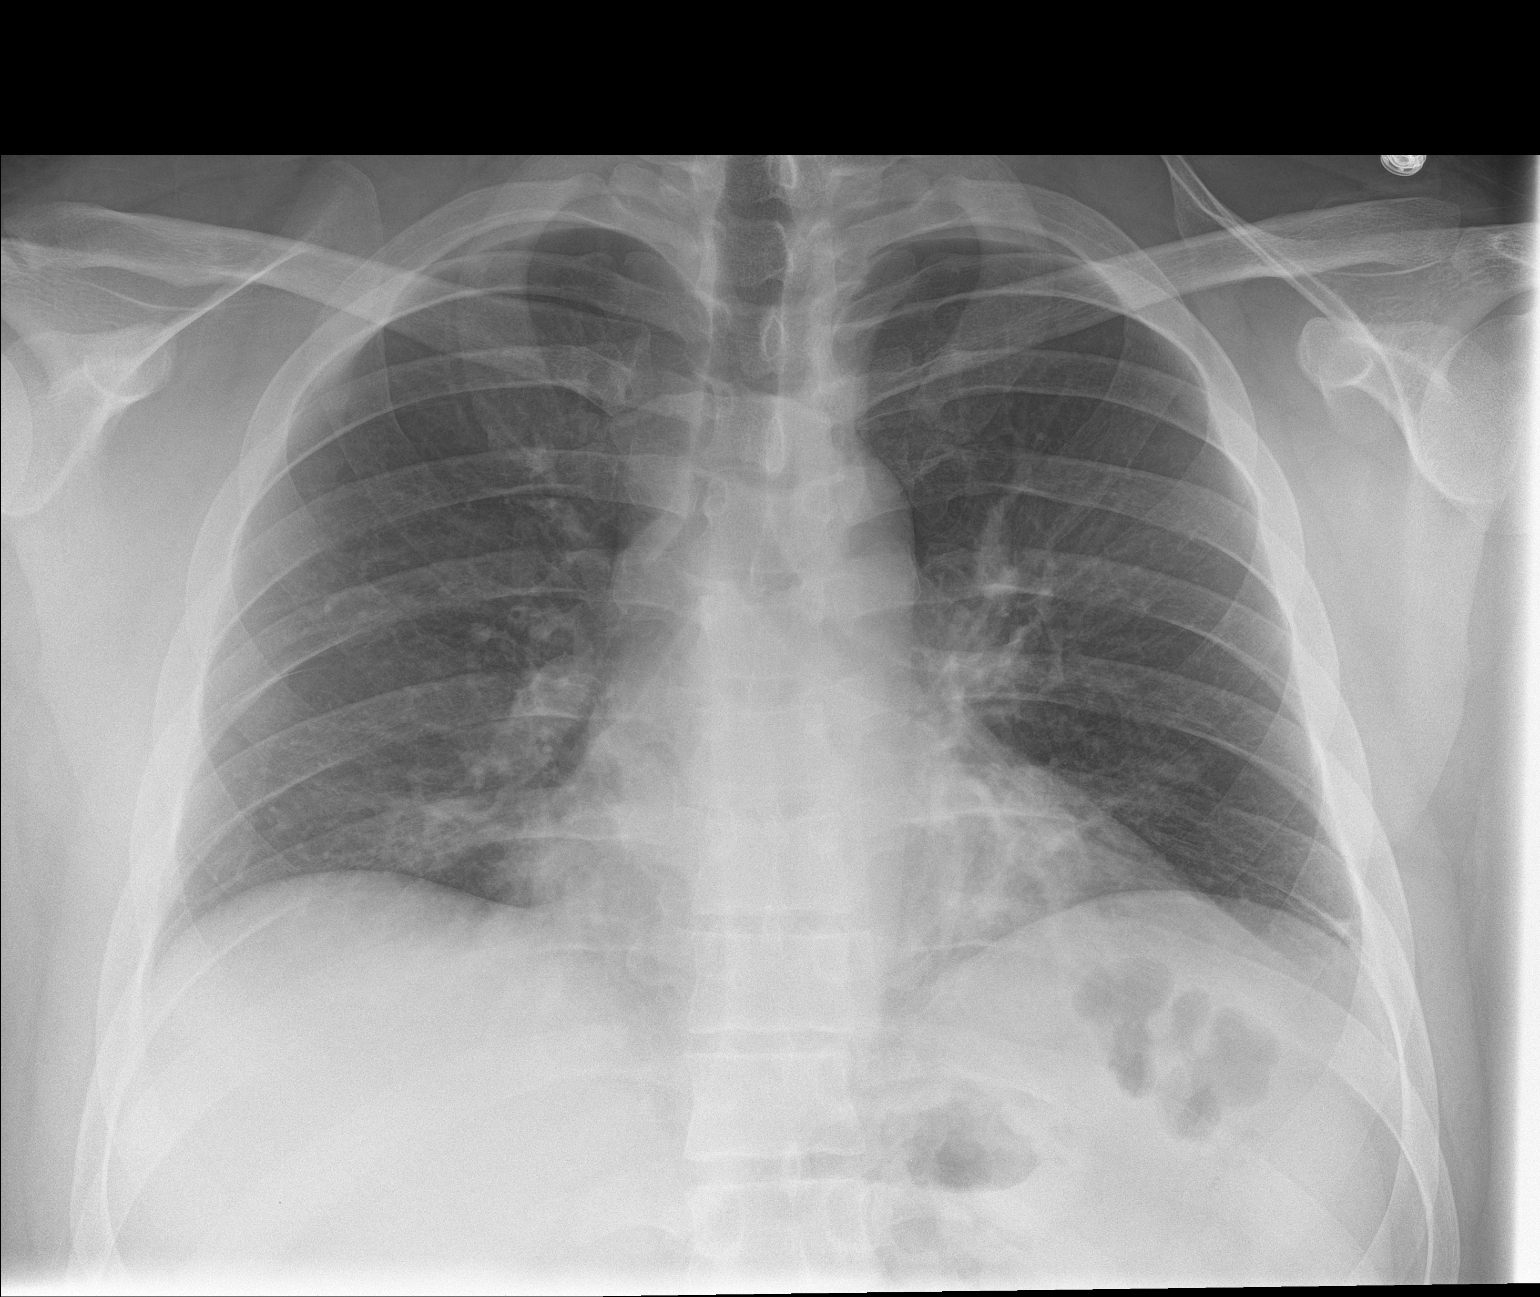

[chest lat]
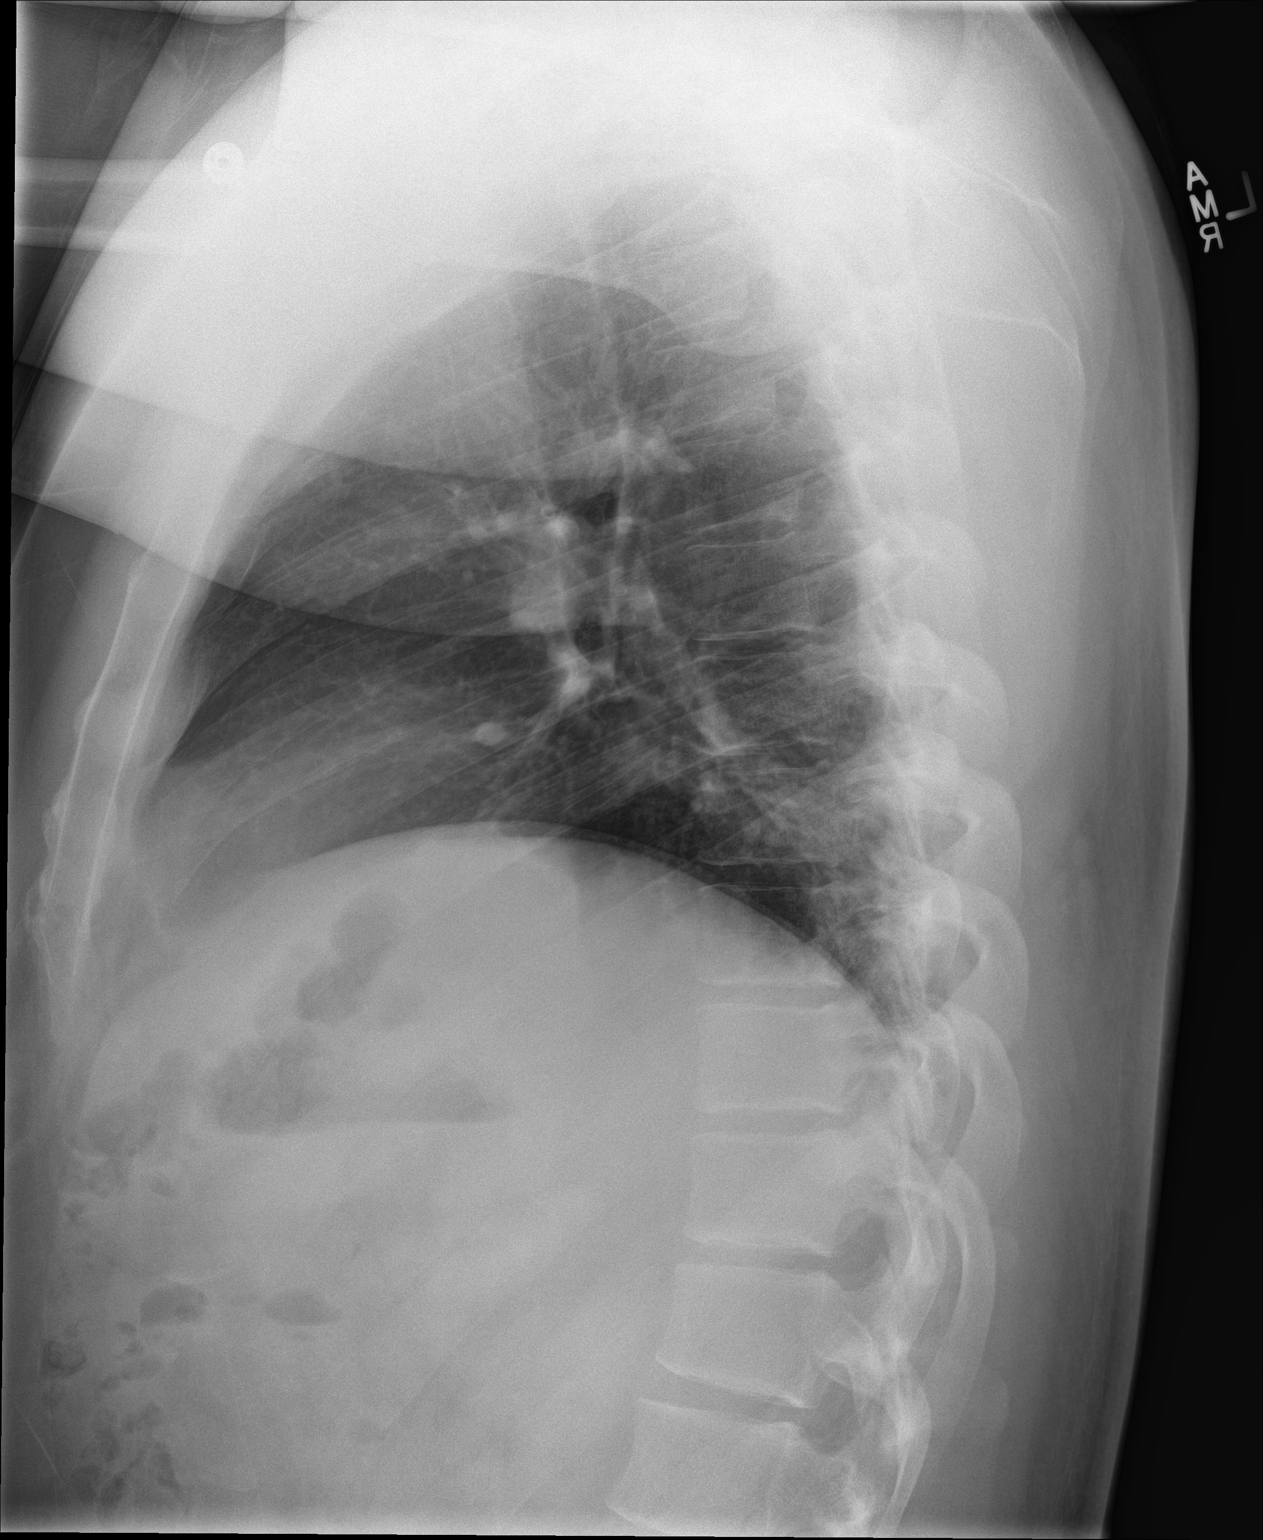

[2 of 2 positions shown; findings below may reference images not displayed]

FINDINGS: There is a small area of infiltrate in the posterior right base.
Lungs elsewhere are clear. Heart size and pulmonary vascularity are
normal. No adenopathy. No bone lesions.
IMPRESSION: Small area of infiltrate felt to represent pneumonia, posterior
right base. Lungs elsewhere clear.

## 2017-11-13 ENCOUNTER — Ambulatory Visit: Payer: Self-pay

## 2017-11-13 ENCOUNTER — Other Ambulatory Visit: Payer: Self-pay | Admitting: Occupational Medicine

## 2017-11-13 DIAGNOSIS — Z0000 Encounter for general adult medical examination without abnormal findings: Secondary | ICD-10-CM

## 2018-04-09 DIAGNOSIS — R509 Fever, unspecified: Secondary | ICD-10-CM | POA: Diagnosis not present

## 2018-04-09 DIAGNOSIS — K429 Umbilical hernia without obstruction or gangrene: Secondary | ICD-10-CM | POA: Diagnosis not present

## 2018-04-09 DIAGNOSIS — R1110 Vomiting, unspecified: Secondary | ICD-10-CM | POA: Diagnosis not present

## 2018-04-09 DIAGNOSIS — R1031 Right lower quadrant pain: Secondary | ICD-10-CM | POA: Diagnosis not present

## 2018-04-09 DIAGNOSIS — Z6841 Body Mass Index (BMI) 40.0 and over, adult: Secondary | ICD-10-CM | POA: Diagnosis not present

## 2018-04-11 DIAGNOSIS — J209 Acute bronchitis, unspecified: Secondary | ICD-10-CM | POA: Diagnosis not present

## 2018-04-11 DIAGNOSIS — R509 Fever, unspecified: Secondary | ICD-10-CM | POA: Diagnosis not present

## 2018-04-11 DIAGNOSIS — Z6841 Body Mass Index (BMI) 40.0 and over, adult: Secondary | ICD-10-CM | POA: Diagnosis not present

## 2018-04-11 DIAGNOSIS — J101 Influenza due to other identified influenza virus with other respiratory manifestations: Secondary | ICD-10-CM | POA: Diagnosis not present

## 2018-05-29 DIAGNOSIS — Z6841 Body Mass Index (BMI) 40.0 and over, adult: Secondary | ICD-10-CM | POA: Diagnosis not present

## 2018-05-29 DIAGNOSIS — R232 Flushing: Secondary | ICD-10-CM | POA: Diagnosis not present

## 2018-08-11 DIAGNOSIS — Z6841 Body Mass Index (BMI) 40.0 and over, adult: Secondary | ICD-10-CM | POA: Diagnosis not present

## 2018-08-11 DIAGNOSIS — S8982XA Other specified injuries of left lower leg, initial encounter: Secondary | ICD-10-CM | POA: Diagnosis not present

## 2018-08-11 DIAGNOSIS — M25462 Effusion, left knee: Secondary | ICD-10-CM | POA: Diagnosis not present

## 2018-12-31 DIAGNOSIS — Z6841 Body Mass Index (BMI) 40.0 and over, adult: Secondary | ICD-10-CM | POA: Diagnosis not present

## 2018-12-31 DIAGNOSIS — A630 Anogenital (venereal) warts: Secondary | ICD-10-CM | POA: Diagnosis not present

## 2019-07-12 DIAGNOSIS — Z20828 Contact with and (suspected) exposure to other viral communicable diseases: Secondary | ICD-10-CM | POA: Diagnosis not present

## 2019-07-12 DIAGNOSIS — J029 Acute pharyngitis, unspecified: Secondary | ICD-10-CM | POA: Diagnosis not present

## 2022-01-15 ENCOUNTER — Encounter: Payer: Self-pay | Admitting: Orthopaedic Surgery

## 2022-01-15 ENCOUNTER — Ambulatory Visit (INDEPENDENT_AMBULATORY_CARE_PROVIDER_SITE_OTHER): Payer: BC Managed Care – PPO | Admitting: Orthopaedic Surgery

## 2022-01-15 VITALS — BP 153/109 | HR 103 | Ht 72.0 in | Wt 317.2 lb

## 2022-01-15 DIAGNOSIS — M25362 Other instability, left knee: Secondary | ICD-10-CM

## 2022-01-15 DIAGNOSIS — G8929 Other chronic pain: Secondary | ICD-10-CM

## 2022-01-15 DIAGNOSIS — M25562 Pain in left knee: Secondary | ICD-10-CM

## 2022-01-15 NOTE — Patient Instructions (Signed)
While we are working on your approval please go ahead and call to schedule your appointment with [facility] today.We need at least 1 week to work on getting prior approval for your imaging. For example, if your appointment is scheduled for the first day of the month, we need you to make your imaging appointment for the 9th day of the month or later.  Central Scheduling [phone]  AFTER you have made your imaging appointment, please call our office back at [phone] to schedule an appointment to review your results. Your follow up appointment will need to be 3-4 days after your imaging is performed to allow time for radiology to read your imaging and for us to get the report. If you are having your imaging performed in a facility not associated with [facility], you will need to ask for a CD with your images on them.   

## 2022-01-15 NOTE — Progress Notes (Signed)
Subjective:    Patient ID: Aaron Waters, male    DOB: [birth year], 36 y.o.   MRN: [id number]  HPI My left knee has been hurting bad over the last few weeks.  He had surgery on the left knee when he was in the tenth grade in 2006.  He does not remember the reason for the surgery.  He has done well until the last few months.   He has swelling and giving way of the left knee.  He says it hyperextends at time and he gives way.  He has been wearing a brace for the last ten days or so.  He was seen by [facility] yesterday. I have copies of his notes.  He has tried ice, rest, heat, brace, NSAIDs and had no help.  It is getting worse.  He has giving way more often.   Review of Systems  Constitutional:  Positive for activity change.  Musculoskeletal:  Positive for arthralgias, gait problem, joint swelling and myalgias.  All other systems reviewed and are negative. For Review of Systems, all other systems reviewed and are negative.  The following is a summary of the past history medically, past history surgically, known current medicines, social history and family history.  This information is gathered electronically by the computer from prior information and documentation.  I review this each visit and have found including this information at this point in the chart is beneficial and informative.   Past Medical History:  Diagnosis Date   Acid reflux    External hemorrhoid     Past Surgical History:  Procedure Laterality Date   COLONOSCOPY N/A 06/02/2013   Procedure: COLONOSCOPY;  Surgeon: Jerene Bears, MD;  Location: [facility];  Service: Gastroenterology;  Laterality: N/A;   KNEE SURGERY Left    football injury   TONSILLECTOMY AND ADENOIDECTOMY     tubes in ears Bilateral    age [age]    Current Outpatient Medications on File Prior to Visit  Medication Sig Dispense Refill   azithromycin (ZITHROMAX Z-PAK) 250 MG tablet 2 po day one, then 1 daily x 4 days 6 tablet 0    ibuprofen (ADVIL,MOTRIN) 200 MG tablet Take 400 mg by mouth every 6 (six) hours as needed.     Nitroglycerin 0.4 % OINT Place 1 application rectally 2 (two) times daily. 1 Tube 2   ondansetron (ZOFRAN) 8 MG tablet Take 1 tablet (8 mg total) by mouth every 4 (four) hours as needed for nausea. 6 tablet 0   No current facility-administered medications on file prior to visit.    Social History   Socioeconomic History   Marital status: Divorced    Spouse name: Not on file   Number of children: 2   Years of education: Not on file   Highest education level: Not on file  Occupational History   Occupation: Chartered loss adjuster: [employer]  Tobacco Use   Smoking status: Former    Types: Cigarettes   Smokeless tobacco: Current    Types: Chew  Substance and Sexual Activity   Alcohol use: No    Alcohol/week: 0.0 standard drinks of alcohol   Drug use: No   Sexual activity: Not on file  Other Topics Concern   Not on file  Social History Narrative   Not on file   Social Determinants of Health   Financial Resource Strain: Not on file  Food Insecurity: Not on file  Transportation Needs: Not on file  Physical Activity:  Not on file  Stress: Not on file  Social Connections: Not on file  Intimate Partner Violence: Not on file    Family History  Problem Relation Age of Onset   Ulcerative colitis Father    Colon cancer Maternal Uncle 48   Heart attack Paternal Grandfather        in his 40's    BP (!) 153/109   Pulse (!) 103   Ht 6' (1.829 m)   Wt (!) 317 lb 3.2 oz (143.9 kg)   BMI 43.02 kg/m   Body mass index is 43.02 kg/m.      Objective:   Physical Exam Vitals and nursing note reviewed. Exam conducted with a chaperone present.  Constitutional:      Appearance: He is well-developed.  HENT:     Head: Normocephalic and atraumatic.  Eyes:     Conjunctiva/sclera: Conjunctivae normal.     Pupils: Pupils are equal, round, and reactive to light.  Cardiovascular:      Rate and Rhythm: Normal rate and regular rhythm.  Pulmonary:     Effort: Pulmonary effort is normal.  Abdominal:     Palpations: Abdomen is soft.  Musculoskeletal:     Cervical back: Normal range of motion and neck supple.       Legs:  Skin:    General: Skin is warm and dry.  Neurological:     Mental Status: He is alert and oriented to person, place, and time.     Cranial Nerves: No cranial nerve deficit.     Motor: No abnormal muscle tone.     Coordination: Coordination normal.     Deep Tendon Reflexes: Reflexes are normal and symmetric. Reflexes normal.  Psychiatric:        Behavior: Behavior normal.        Thought Content: Thought content normal.        Judgment: Judgment normal.   I have independently reviewed and interpreted x-rays of this patient done at another site by another physician or qualified health professional.         Assessment & Plan:   Encounter Diagnoses  Name Primary?   Chronic pain of left knee Yes   Other instability, left knee    I would like to get MRI of the left knee.  I am concerned about meniscus tear as well as ACL strain/tear.  He may need arthroscopy.  He has giving way and instability.  Return in two weeks or earlier if we can get the MRI.  Use the brace.  Call if any problem.  Precautions discussed.  Electronically Signed Darreld Mclean, MD 10/31/20231:57 PM

## 2022-01-23 ENCOUNTER — Telehealth: Payer: Self-pay

## 2022-01-23 NOTE — Telephone Encounter (Signed)
Patient scheduled to come in for an MRI review on 01/29/22 with Dr.Keeling. He has not scheduled the MRI yet. Please call him to see if he will be scheduling that and push his review appt out further.

## 2022-01-29 ENCOUNTER — Ambulatory Visit: Payer: BC Managed Care – PPO | Admitting: Orthopaedic Surgery
